# Patient Record
Sex: Male | Born: 1985 | Race: Black or African American | Hispanic: No | Marital: Single | Smoking: Never smoker
Health system: Southern US, Community
[De-identification: ages and names within clinical notes are randomized; demographics above are authoritative.]

---

## 2006-06-21 ENCOUNTER — Ambulatory Visit: Payer: Self-pay | Admitting: General Practice

## 2007-03-07 ENCOUNTER — Emergency Department: Payer: Self-pay | Admitting: Emergency Medicine

## 2008-03-07 IMAGING — CR RIGHT HAND - COMPLETE 3+ VIEW
1 series · 3 of 3 positions shown · non-contrast
Comparison: none

REASON FOR EXAM: pain
COMMENTS:

PROCEDURE:     DXR - DXR HAND RT COMPLETE W/OBLIQUES  - June 21, 2006  [DATE]
RESULT:     Three views show no fracture, dislocation or other acute bony
abnormality. There is noted soft tissue swelling at PIP joint of the fourth
finger.

[Series 1: view not recorded · 0.17mm/px · 3 of 3 slices shown]
[im 1/3]
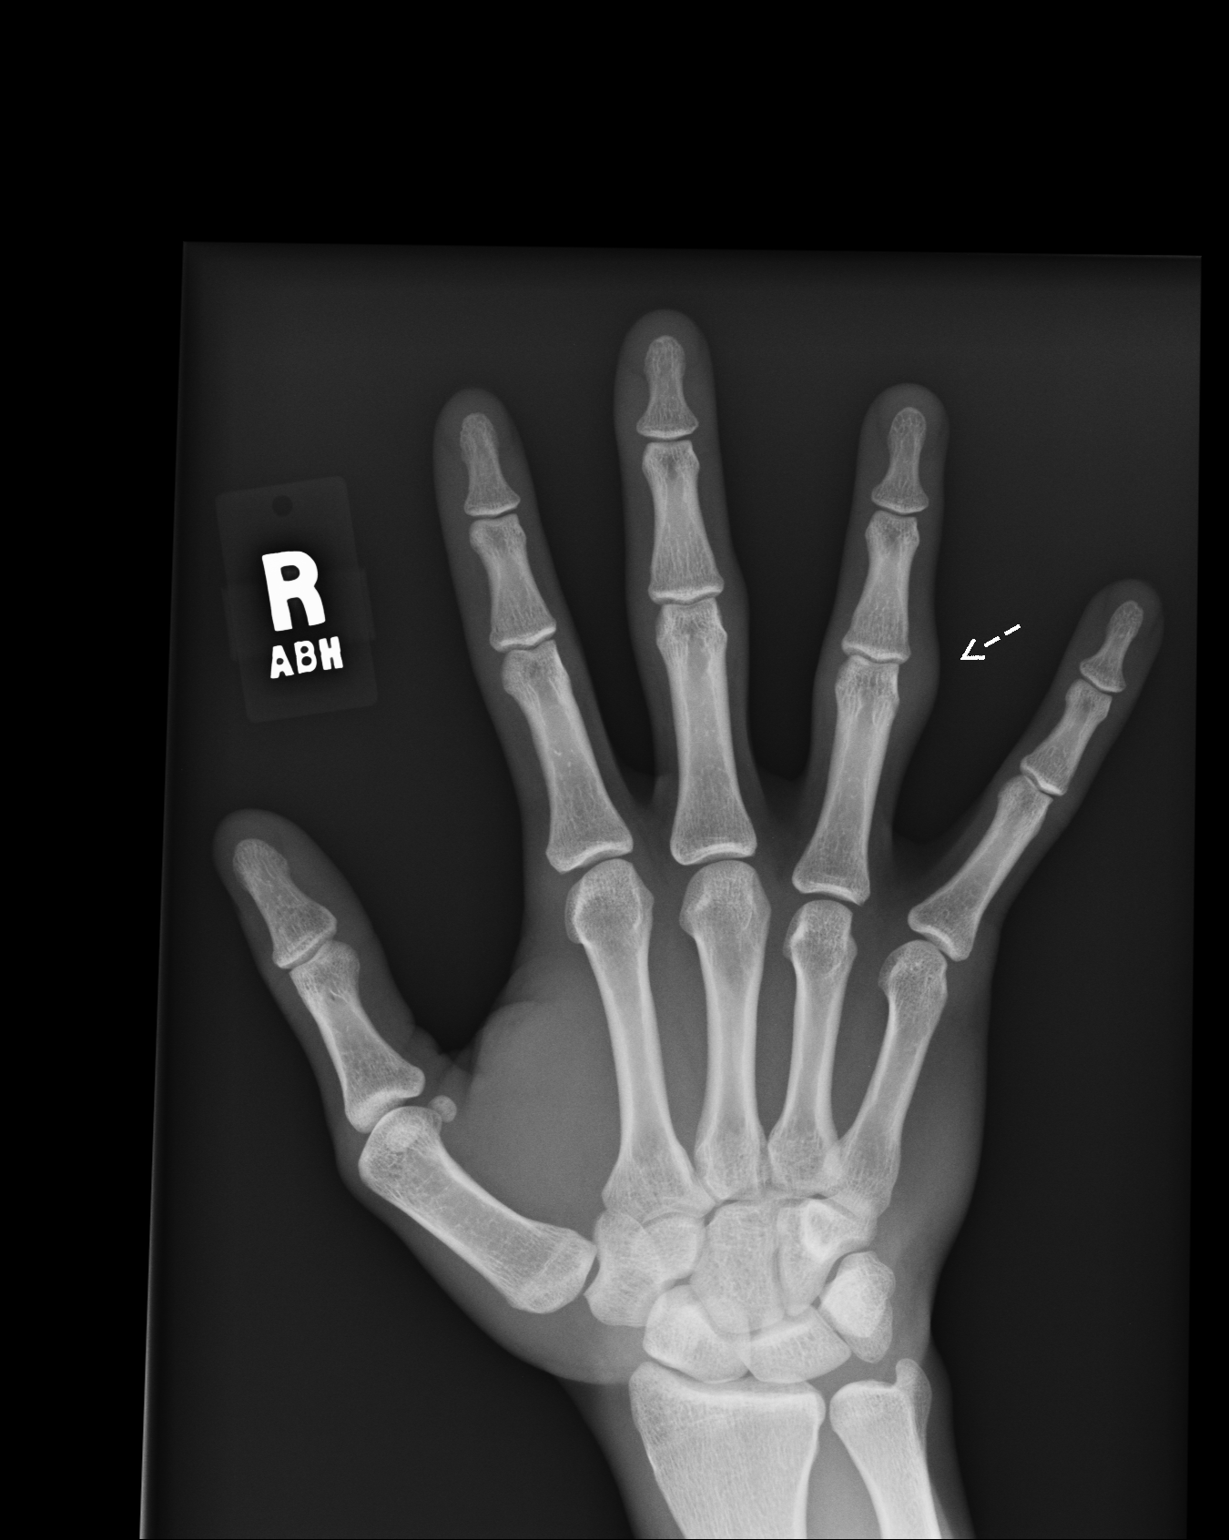
[im 2/3]
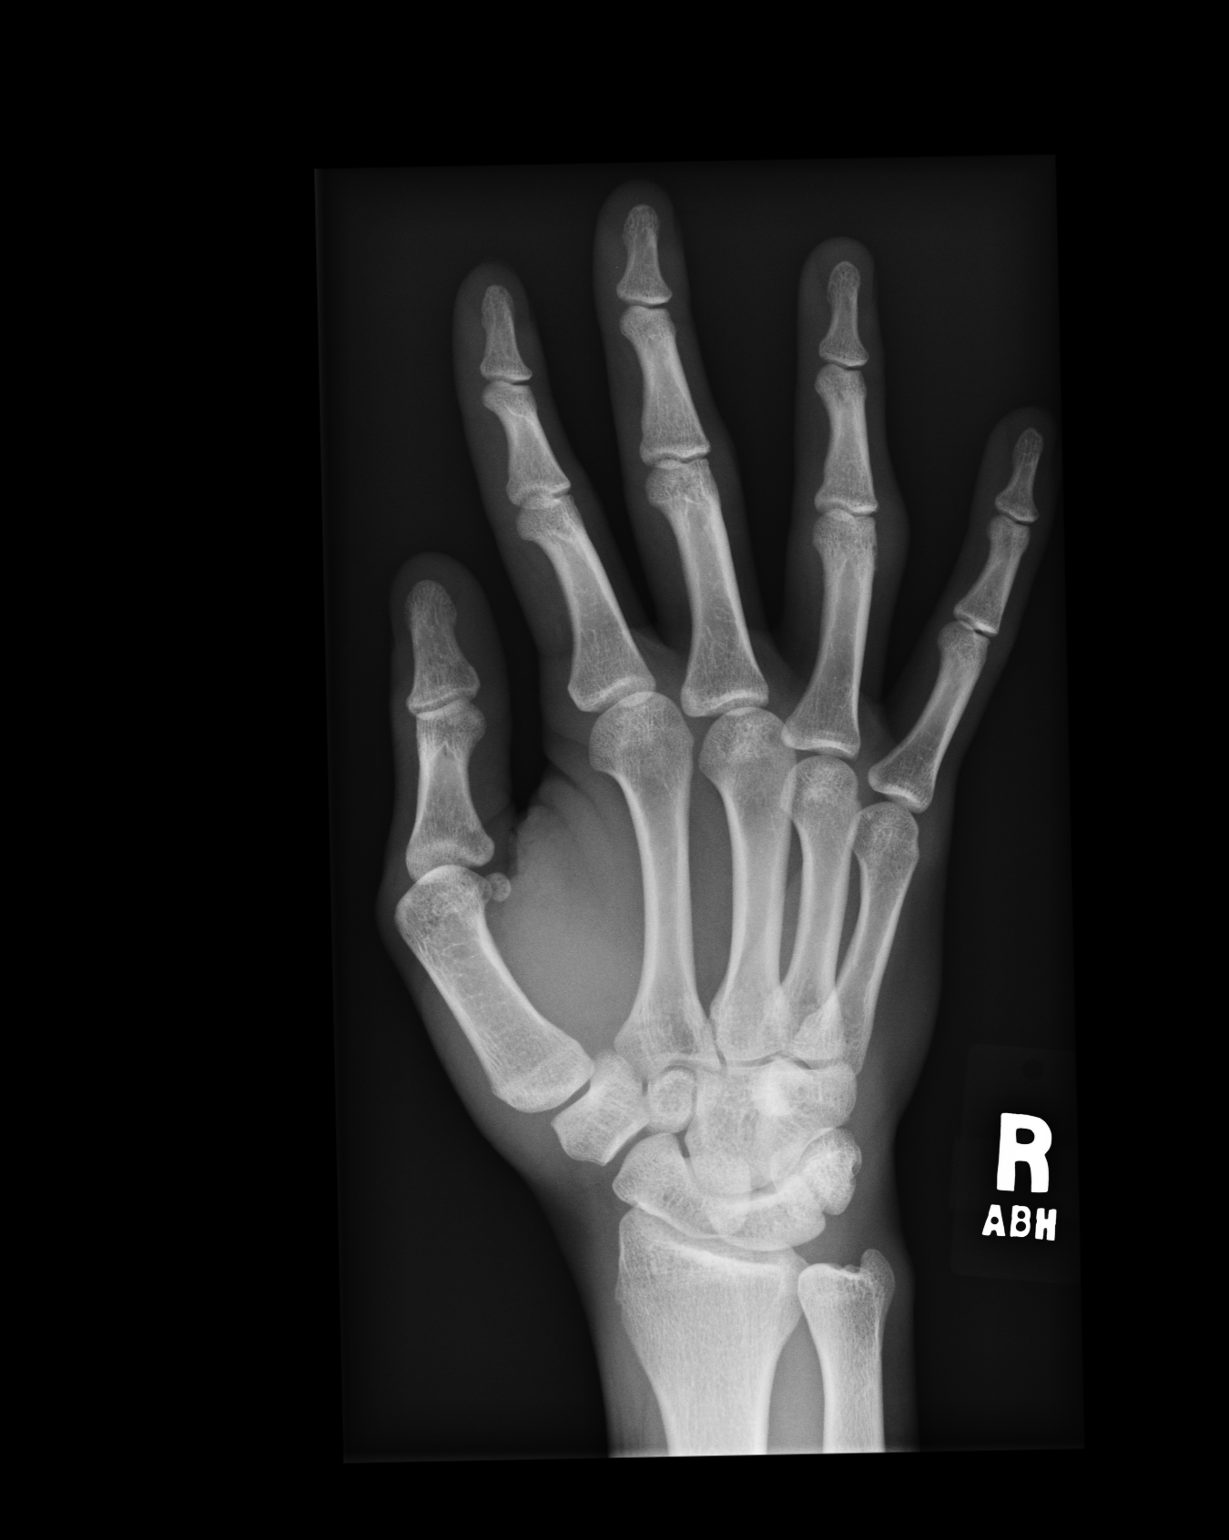
[im 3/3]
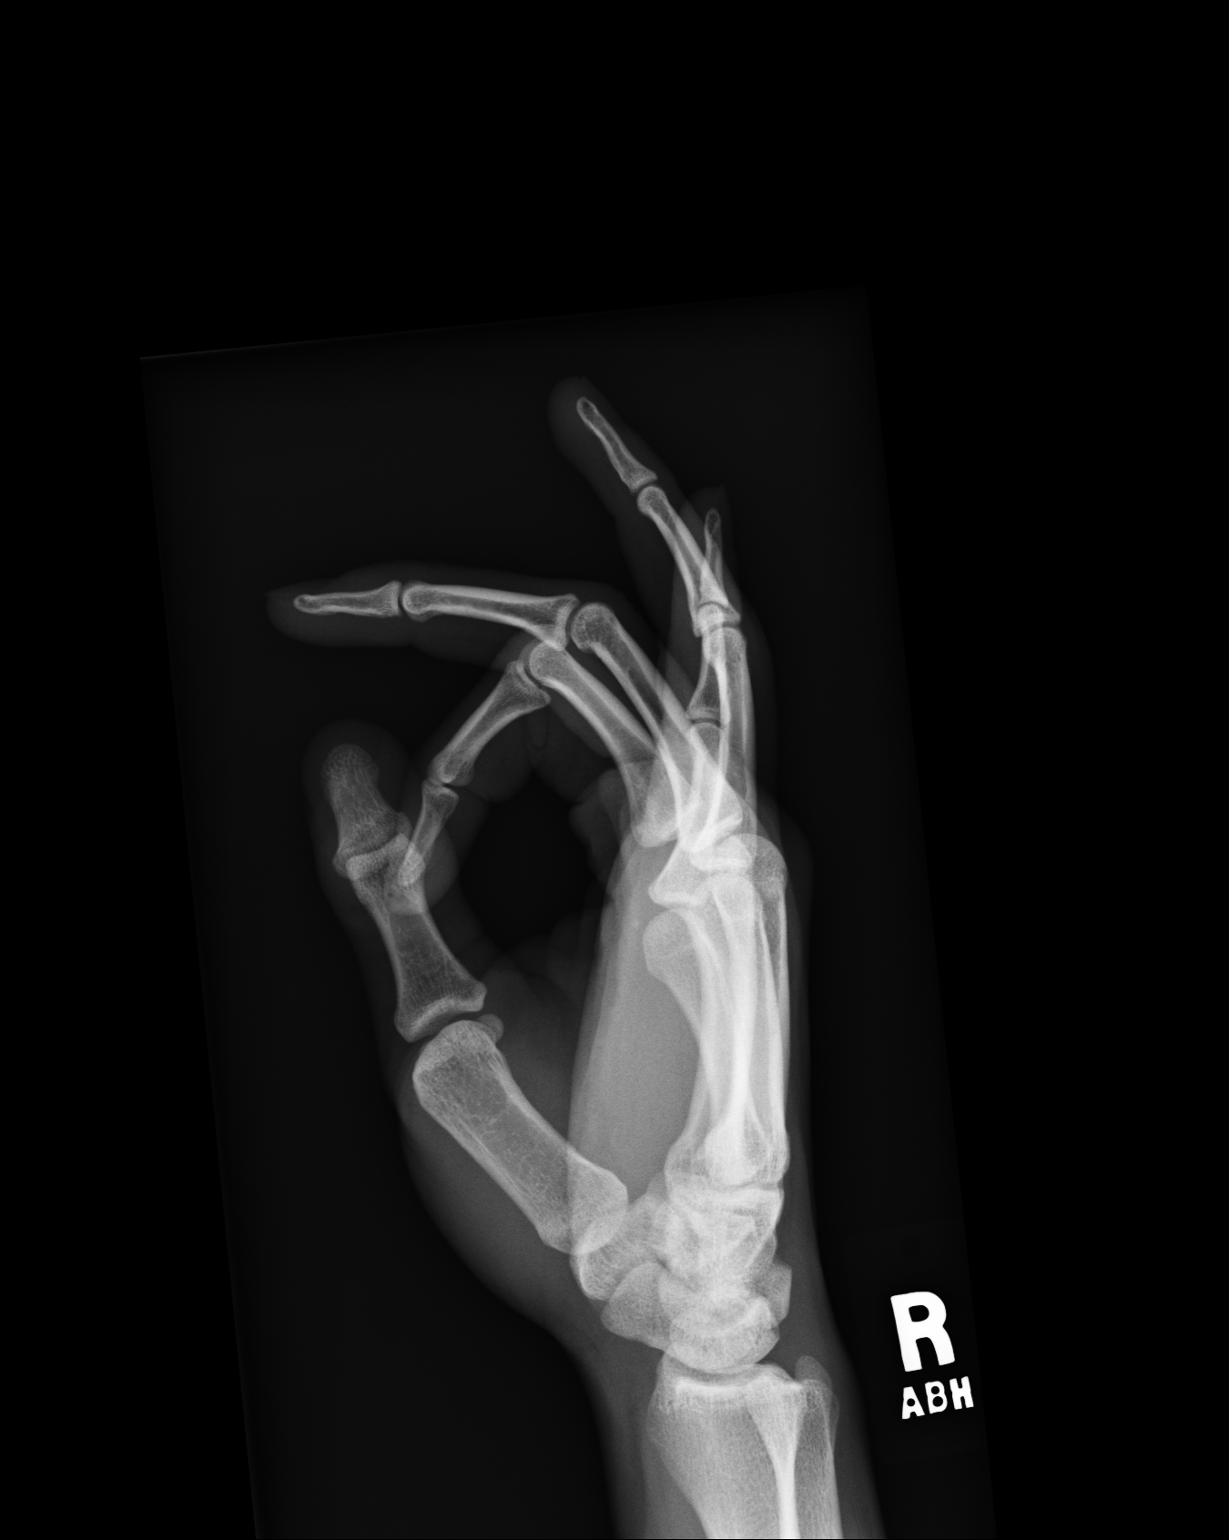

[3 of 3 positions shown; findings below may reference images not displayed]

IMPRESSION: No acute bony abnormalities are identified.

## 2011-03-04 ENCOUNTER — Encounter: Payer: Self-pay | Admitting: *Deleted

## 2011-03-04 ENCOUNTER — Emergency Department (HOSPITAL_COMMUNITY)
Admission: EM | Admit: 2011-03-04 | Discharge: 2011-03-04 | Disposition: A | Discharge status: Home / Self Care | Payer: Self-pay | Attending: Emergency Medicine | Admitting: Emergency Medicine

## 2011-03-04 DIAGNOSIS — H9319 Tinnitus, unspecified ear: Secondary | ICD-10-CM | POA: Insufficient documentation

## 2011-03-04 DIAGNOSIS — J45909 Unspecified asthma, uncomplicated: Secondary | ICD-10-CM | POA: Insufficient documentation

## 2011-03-04 DIAGNOSIS — J3489 Other specified disorders of nose and nasal sinuses: Secondary | ICD-10-CM | POA: Insufficient documentation

## 2011-03-04 DIAGNOSIS — R51 Headache: Secondary | ICD-10-CM | POA: Insufficient documentation

## 2011-03-04 DIAGNOSIS — H9209 Otalgia, unspecified ear: Secondary | ICD-10-CM | POA: Insufficient documentation

## 2011-03-04 DIAGNOSIS — J329 Chronic sinusitis, unspecified: Secondary | ICD-10-CM | POA: Insufficient documentation

## 2011-03-04 MED ORDER — CETIRIZINE-PSEUDOEPHEDRINE ER 5-120 MG PO TB12: 1.0000 | ORAL_TABLET | Freq: Every day | ORAL | Status: AC

## 2011-03-04 MED ORDER — IBUPROFEN 600 MG PO TABS: 600.0000 mg | ORAL_TABLET | Freq: Four times a day (QID) | ORAL | Status: AC | PRN

## 2011-03-04 MED ORDER — FLUTICASONE PROPIONATE 50 MCG/ACT NA SUSP: 2.0000 | Freq: Every day | NASAL | Status: DC

## 2011-03-04 NOTE — ED Notes (Signed)
Patient states he started having headaches with neck pain X 3 months ago and used tylenol but no relief. Patient states he has ringing in his right ear from time to time and it feels like he has water in his ear x 3 months.  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 Patient denies N/V with the headaches. Patient states he has dizziness with the headaches. Patient denies numbness or tingling in his extremities.

## 2011-03-04 NOTE — ED Notes (Signed)
C/o rt sided head aches x 3 months, states pain is intermittent and mostly when awakening. Also assoc with light sensitivity. Denies n/v. Pt also states feeling like rt ear is clogged. Pt in nad.

## 2011-03-04 NOTE — ED Provider Notes (Signed)
History     CSN: [id number]  Arrival date & time 03/04/11  1545   First MD Initiated Contact with Patient 03/04/11 1807      Chief Complaint  Patient presents with  . Migraine    (Consider location/radiation/quality/duration/timing/severity/associated sxs/prior treatment) Patient is a 25 y.o. male presenting with migraine. The history is provided by the patient.  Migraine This is a new problem. The current episode started more than 1 month ago. The problem occurs intermittently. Associated symptoms include congestion and headaches. Pertinent negatives include no abdominal pain, chills, coughing, diaphoresis, fatigue, fever, myalgias, nausea, rash, sore throat, swollen glands, vomiting or weakness. The symptoms are aggravated by nothing. He has tried nothing for the symptoms.  Pt reports right sided headache over right eye, ear, temple area. States feels congested, ear is popping, ringing at times, nasal congestion. States taking tylenol with no improvement. Denies fever, chills, malaise. Pain on and off. Denies dizziness, blurred vision, nausea, vomiting, weakness, focal neurological symptoms.   Past Medical History  Diagnosis Date  . Asthma when he was a child but grew out of it.    History reviewed. No pertinent past surgical history.  History reviewed. No pertinent family history.  History  Substance Use Topics  . Smoking status: Never Smoker   . Smokeless tobacco: Not on file  . Alcohol Use: No      Review of Systems  Constitutional: Negative for fever, chills, diaphoresis and fatigue.  HENT: Positive for congestion. Negative for sore throat.   Eyes: Negative.   Respiratory: Negative for cough.   Cardiovascular: Negative.   Gastrointestinal: Negative.  Negative for nausea, vomiting and abdominal pain.  Genitourinary: Negative.   Musculoskeletal: Negative for myalgias.  Skin: Negative for rash.  Neurological: Positive for headaches. Negative for weakness.    Psychiatric/Behavioral: Negative.     Allergies  Sulfa antibiotics  Home Medications  No current outpatient prescriptions on file.  BP 119/68  Pulse 94  Temp(Src) 98.2 F (36.8 C) (Oral)  Resp 19  SpO2 98%  Physical Exam  Nursing note and vitals reviewed. Constitutional: He is oriented to person, place, and time. He appears well-nourished.  HENT:  Head: Normocephalic.  Right Ear: External ear and ear canal normal.  Left Ear: Tympanic membrane and external ear normal.  Nose: Rhinorrhea present. Right sinus exhibits maxillary sinus tenderness and frontal sinus tenderness. Left sinus exhibits no maxillary sinus tenderness and no frontal sinus tenderness.  Mouth/Throat: Uvula is midline, oropharynx is clear and moist and mucous membranes are normal.       Fluid behind right TM  Eyes: Conjunctivae and EOM are normal. Pupils are equal, round, and reactive to light.  Neck: Neck supple.  Cardiovascular: Normal rate, regular rhythm and normal heart sounds.   Pulmonary/Chest: Effort normal and breath sounds normal. No respiratory distress.  Musculoskeletal: Normal range of motion. He exhibits no edema.  Neurological: He is alert and oriented to person, place, and time. He has normal reflexes. No cranial nerve deficit. Coordination abnormal.  Skin: Skin is warm and dry. No erythema.  Psychiatric: He has a normal mood and affect.    ED Course  Procedures (including critical care time)  Pt is a healthy 25yo male, no medical problems. VS normal. Exam normal. Complaining of pressure like headache over right eye and ear, along with congestion. Right TM with fluid behind it, rhinorrhea noted on exam. Sinuses tender. Will start treatment for sinusitis. If not improving advised to follow up for recheck and further  evaluation. At this time no signs of intracranial abnormalities based on exam and hx.   MDM          Lottie Mussel, PA 03/04/11 1827

## 2011-03-04 NOTE — ED Provider Notes (Signed)
Medical screening examination/treatment/procedure(s) were performed by non-physician practitioner and as supervising physician I was immediately available for consultation/collaboration.  Geet Hosking L Alani Sabbagh, MD 03/04/11 2043 

## 2019-03-05 ENCOUNTER — Other Ambulatory Visit: Payer: Self-pay

## 2019-03-05 ENCOUNTER — Emergency Department (HOSPITAL_COMMUNITY)
Admission: EM | Admit: 2019-03-05 | Discharge: 2019-03-06 | Disposition: A | Discharge status: Home / Self Care | Payer: Medicaid Other | Attending: Emergency Medicine | Admitting: Emergency Medicine

## 2019-03-05 ENCOUNTER — Encounter (HOSPITAL_COMMUNITY): Payer: Self-pay

## 2019-03-05 DIAGNOSIS — R109 Unspecified abdominal pain: Secondary | ICD-10-CM

## 2019-03-05 DIAGNOSIS — R1011 Right upper quadrant pain: Secondary | ICD-10-CM | POA: Diagnosis present

## 2019-03-05 LAB — COMPREHENSIVE METABOLIC PANEL
ALT: 14 U/L (ref 0–44)
AST: 22 U/L (ref 15–41)
Albumin: 4.5 g/dL (ref 3.5–5.0)
Alkaline Phosphatase: 45 U/L (ref 38–126)
Anion gap: 8 (ref 5–15)
BUN: 8 mg/dL (ref 6–20)
CO2: 27 mmol/L (ref 22–32)
Calcium: 9.3 mg/dL (ref 8.9–10.3)
Chloride: 105 mmol/L (ref 98–111)
Creatinine, Ser: 1.02 mg/dL (ref 0.61–1.24)
GFR calc Af Amer: >60 mL/min (ref >60)
GFR calc non Af Amer: >60 mL/min (ref >60)
Glucose, Bld: 60 mg/dL — ABNORMAL LOW (ref 70–99)
Potassium: 3.8 mmol/L (ref 3.5–5.1)
Sodium: 140 mmol/L (ref 135–145)
Total Bilirubin: 0.9 mg/dL (ref 0.3–1.2)
Total Protein: 7.4 g/dL (ref 6.5–8.1)

## 2019-03-05 LAB — CBC
HCT: 44.1 % (ref 39.0–52.0)
Hemoglobin: 15.4 g/dL (ref 13.0–17.0)
MCH: 31.8 pg (ref 26.0–34.0)
MCHC: 34.9 g/dL (ref 30.0–36.0)
MCV: 90.9 fL (ref 80.0–100.0)
Platelets: 261 10*3/uL (ref 150–400)
RBC: 4.85 MIL/uL (ref 4.22–5.81)
RDW: 11.9 % (ref 11.5–15.5)
WBC: 5.3 10*3/uL (ref 4.0–10.5)
nRBC: 0 % (ref 0.0–0.2)

## 2019-03-05 LAB — LIPASE, BLOOD: Lipase: 29 U/L (ref 11–51)

## 2019-03-05 NOTE — ED Triage Notes (Signed)
Pt reports RUQ pain for 2 months now, denies nausea or vomiting.

## 2019-03-06 ENCOUNTER — Emergency Department (HOSPITAL_COMMUNITY): Payer: Medicaid Other

## 2019-03-06 LAB — URINALYSIS, ROUTINE W REFLEX MICROSCOPIC
Bilirubin Urine: NEGATIVE
Glucose, UA: NEGATIVE mg/dL
Hgb urine dipstick: NEGATIVE
Ketones, ur: NEGATIVE mg/dL
Leukocytes,Ua: NEGATIVE
Nitrite: NEGATIVE
Protein, ur: NEGATIVE mg/dL
Specific Gravity, Urine: 1.027 (ref 1.005–1.030)
pH: 5 (ref 5.0–8.0)

## 2019-03-06 LAB — CBG MONITORING, ED: Glucose-Capillary: 90 mg/dL (ref 70–99)

## 2019-03-06 NOTE — ED Provider Notes (Signed)
[facility] EMERGENCY DEPARTMENT Provider Note   CSN: [id number] Arrival date & time: 03/05/19  1824     History Chief Complaint  Patient presents with  . Abdominal Pain    Charles Huerta is a 33 y.o. male with no ppmh presents to ER for evaluation of abdominal pain. Located in RUQ described as "slight discomfort". Onset 2.5 months ago. Intermittent, non radiating. Worse with sitting for prolonged sitting, bending over, palpation. It feels like a twitching muscle.  States it is not a pain but a "discomfort", currently mild.  No changes with eating, drinking breathing.  No interventions.  Had tingling with urination for a couple of days one week ago and girlfriend got him a [facility] UTI test that came back positive.  States tingling with urination has resolved.  No hematuria, urinary urgency or frequency. No penile discharge or testicular pain. He is sexually active with male partner without condom use but states he has no concern for STD.  Would like to be tested to be certain.    No issues with gallbladder before. No abdominal surgeries. No associated fever, nausea, vomiting, post prandrial pain or acid reflux symptoms. No associated CP, Sob, cough.   HPI     Past Medical History:  Diagnosis Date  . Asthma when he was a child but grew out of it.    There are no problems to display for this patient.   History reviewed. No pertinent surgical history.     No family history on file.  Social History   Tobacco Use  . Smoking status: Never Smoker  Substance Use Topics  . Alcohol use: No  . Drug use: Not on file    Home Medications Prior to Admission medications   Medication Sig Start Date End Date Taking? Authorizing Provider  fluticasone (FLONASE) 50 MCG/ACT nasal spray Place 2 sprays into the nose daily. 03/04/11 03/03/12  Kirichenko, Lemont Fillers, PA-C    Allergies    Sulfa antibiotics  Review of Systems   Review of Systems  Gastrointestinal: Positive  for abdominal pain.  Genitourinary: Positive for difficulty urinating (tingling, resolved).  All other systems reviewed and are negative.   Physical Exam Updated Vital Signs BP 130/69 (BP Location: Right Arm)   Pulse (!) 58   Temp 97.7 F (36.5 C) (Oral)   Resp 13   Ht 5' 10.5" (1.791 m)   Wt 77.1 kg   SpO2 100%   BMI 24.05 kg/m   Physical Exam Vitals and nursing note reviewed.  Constitutional:      Appearance: He is well-developed.     Comments: Non toxic.  HENT:     Head: Normocephalic and atraumatic.     Nose: Nose normal.  Eyes:     Conjunctiva/sclera: Conjunctivae normal.  Cardiovascular:     Rate and Rhythm: Normal rate and regular rhythm.     Heart sounds: Normal heart sounds.  Pulmonary:     Effort: Pulmonary effort is normal.     Breath sounds: Normal breath sounds.  Abdominal:     General: Bowel sounds are normal.     Palpations: Abdomen is soft.     Tenderness: There is no abdominal tenderness.     Comments: No G/R/R. No suprapubic or CVA tenderness. Negative Murphy's and McBurney's  Genitourinary:    Comments: Patient declined GU exam  Musculoskeletal:        General: Normal range of motion.     Cervical back: Normal range of motion.  Skin:  General: Skin is warm and dry.     Capillary Refill: Capillary refill takes less than 2 seconds.     Comments: Skin normal over abdomen/flank, no rash or lesions  Neurological:     Mental Status: He is alert.  Psychiatric:        Behavior: Behavior normal.     ED Results / Procedures / Treatments   Labs (all labs ordered are listed, but only abnormal results are displayed) Labs Reviewed  COMPREHENSIVE METABOLIC PANEL - Abnormal; Notable for the following components:      Result Value   Glucose, Bld 60 (*)    All other components within normal limits  LIPASE, BLOOD  CBC  URINALYSIS, ROUTINE W REFLEX MICROSCOPIC  CBG MONITORING, ED  GC/CHLAMYDIA PROBE AMP ([place]) NOT AT [facility]     EKG None  Radiology US Abdomen Limited RUQ  Result Date: 03/06/2019 CLINICAL DATA:  Right upper quadrant pain EXAM: ULTRASOUND ABDOMEN LIMITED RIGHT UPPER QUADRANT COMPARISON:  None. FINDINGS: Gallbladder: No gallstones or wall thickening visualized. There is no pericholecystic fluid. No sonographic Murphy sign noted by sonographer. Common bile duct: Diameter: 2 mm. No intrahepatic or extrahepatic biliary duct dilatation. Liver: No focal lesion identified. Within normal limits in parenchymal echogenicity. Portal vein is patent on color Doppler imaging with normal direction of blood flow towards the liver. Other: None. IMPRESSION: Study within normal limits. Electronically Signed   By: Lowella Grip III M.D.   On: 03/06/2019 07:52    Procedures Procedures (including critical care time)  Medications Ordered in ED Medications - No data to display  ED Course  I have reviewed the triage vital signs and the nursing notes.  Pertinent labs & imaging results that were available during my care of the patient were reviewed by me and considered in my medical decision making (see chart for details).    MDM Rules/Calculators/A&P                      Highest on ddx is MSK etiology vs gall bladder stones. Doubt cholecystitis, choledocholithiasis, cholangitis given chronicity of symptoms, lack of constitutional symptoms, and benign exam.  Skin normal over this area. Doubt GU process and initial tingling with urination has resolved and he has no UTI symptoms, CVAT.  No radiation of pain into chest, SOB, cough, pleuritic component.   ER work-up initiated in triage personally reviewed and normal.  LFTs, lipase and WBC normal.  UA unremarkable. RUQ normal.   Given transient resolved discomfort with urination, urine culture and urine GC/c will be added. Patient has no concern for STD. He declined GU exam but denies any skin irritation, testicular pain, discharge.    Recommend NSAID as needed, PCP  f/u for continued abdominal pain. Return precautions given.  Final Clinical Impression(s) / ED Diagnoses Final diagnoses:  RUQ pain  Right lateral abdominal pain    Rx / DC Orders ED Discharge Orders    None       Kinnie Feil, PA-C 03/06/19 0827    Davonna Belling, MD 03/06/19 1055

## 2019-03-06 NOTE — Discharge Instructions (Signed)
You were seen in the ER for right abdominal pain.  Lab work was normal.  Ultrasound of your right upper abdomen, liver and gallbladder are normal.  Cause of your pain is unclear, could be related to muscular injury.  Alternate ibuprofen and acetaminophen as needed for pain.  If your pain continues follow-up with your primary care doctor for further instructions and possible work-up.  Return to the ER for fever greater than 100.4, nausea, vomiting, worsening constant right upper quadrant or right lower abdominal pain, urinary symptoms, rash.  Cause of your urinary discomfort a couple of days ago is also unclear.  Your urine test was normal.  We have sent gonorrhea and Chlamydia testing to rule this out, follow-up on these results on your MyChart.  Return to the ER for return of symptoms, penile discharge, testicular pain, abdominal pain, fever

## 2019-03-06 NOTE — ED Notes (Signed)
Patient verbalizes understanding of discharge instructions. Opportunity for questioning and answers were provided. Armband removed by staff, pt discharged from ED.  

## 2019-03-07 LAB — GC/CHLAMYDIA PROBE AMP (~~LOC~~) NOT AT ARMC
Chlamydia: NEGATIVE
Neisseria Gonorrhea: NEGATIVE

## 2019-08-30 ENCOUNTER — Other Ambulatory Visit: Payer: Self-pay

## 2019-08-30 ENCOUNTER — Encounter (HOSPITAL_COMMUNITY): Payer: Self-pay

## 2019-08-30 ENCOUNTER — Emergency Department (HOSPITAL_COMMUNITY)
Admission: EM | Admit: 2019-08-30 | Discharge: 2019-08-30 | Disposition: A | Discharge status: Home / Self Care | Payer: Medicaid Other | Attending: Emergency Medicine | Admitting: Emergency Medicine

## 2019-08-30 DIAGNOSIS — M545 Low back pain, unspecified: Secondary | ICD-10-CM

## 2019-08-30 DIAGNOSIS — J45909 Unspecified asthma, uncomplicated: Secondary | ICD-10-CM | POA: Diagnosis not present

## 2019-08-30 MED ORDER — CYCLOBENZAPRINE HCL 10 MG PO TABS: 10.0000 mg | ORAL_TABLET | Freq: Two times a day (BID) | ORAL | 0 refills | Status: DC | PRN

## 2019-08-30 MED ORDER — LIDOCAINE 5 % EX PTCH
1.0000 | MEDICATED_PATCH | Freq: Once | CUTANEOUS | Status: DC
  Administered 2019-08-30: 1 via TRANSDERMAL
  Filled 2019-08-30: qty 1

## 2019-08-30 MED ORDER — NAPROXEN 500 MG PO TABS: 500.0000 mg | ORAL_TABLET | Freq: Two times a day (BID) | ORAL | 0 refills | Status: AC

## 2019-08-30 MED ORDER — NAPROXEN 250 MG PO TABS
500.0000 mg | ORAL_TABLET | Freq: Once | ORAL | Status: AC
  Administered 2019-08-30: 500 mg via ORAL
  Filled 2019-08-30: qty 2

## 2019-08-30 NOTE — ED Triage Notes (Signed)
Patient complains of right side and lower back pain. Works for [employer] and does a lot of lifting. Pain worse with any movement and change in position

## 2019-08-30 NOTE — ED Provider Notes (Signed)
[facility] EMERGENCY DEPARTMENT Provider Note   CSN: [id number] Arrival date & time: 08/30/19  0818     History Chief Complaint  Patient presents with  . back pain/ side pain    Charles Huerta is a 34 y.o. male with past medical history significant for asthma.  HPI Patient presents to the emergency department today with chief complaint of right side and low back pain x2 days.  Patient states he works for a [employer] and was lifting a heavy couch.  He states later in the day he had right-sided low back pain.  He states the pain is intermittent and is worse with movement or bending over. There is no radiation of pain.  He rates the pain 8 of 10 in severity.  He has not taken any medications for his symptoms prior to arrival. Denies fevers, weight loss, numbness/weakness of upper and lower extremities, bowel/bladder incontinence, urinary retention, history of cancer, saddle anesthesia, history of back surgery, history of IVDA.     Past Medical History:  Diagnosis Date  . Asthma when he was a child but grew out of it.    There are no problems to display for this patient.   History reviewed. No pertinent surgical history.     No family history on file.  Social History   Tobacco Use  . Smoking status: Never Smoker  Substance Use Topics  . Alcohol use: No  . Drug use: Not on file    Home Medications Prior to Admission medications   Medication Sig Start Date End Date Taking? Authorizing Provider  cyclobenzaprine (FLEXERIL) 10 MG tablet Take 1 tablet (10 mg total) by mouth 2 (two) times daily as needed for muscle spasms. 08/30/19   Leahna Hewson E, PA-C  fluticasone (FLONASE) 50 MCG/ACT nasal spray Place 2 sprays into the nose daily. 03/04/11 03/03/12  Kirichenko, Lemont Fillers, PA-C  naproxen (NAPROSYN) 500 MG tablet Take 1 tablet (500 mg total) by mouth 2 (two) times daily for 7 days. 08/30/19 09/06/19  Persephonie Hegwood, Caroleen Hamman, PA-C    Allergies    Sulfa  antibiotics  Review of Systems   Review of Systems  All other systems are reviewed and are negative for acute change except as noted in the HPI.   Physical Exam Updated Vital Signs BP 116/73 (BP Location: Left Arm)   Pulse (!) 53   Temp 98 F (36.7 C) (Oral)   Resp 18   Ht 5\' 11"  (1.803 m)   Wt 74.8 kg   SpO2 99%   BMI 23.01 kg/m   Physical Exam Vitals and nursing note reviewed.  Constitutional:      Appearance: He is well-developed. He is not ill-appearing or toxic-appearing.  HENT:     Head: Normocephalic and atraumatic.     Nose: Nose normal.  Eyes:     General: No scleral icterus.       Right eye: No discharge.        Left eye: No discharge.     Conjunctiva/sclera: Conjunctivae normal.  Neck:     Vascular: No JVD.  Cardiovascular:     Rate and Rhythm: Normal rate and regular rhythm.     Pulses: Normal pulses.     Heart sounds: Normal heart sounds.  Pulmonary:     Effort: Pulmonary effort is normal.     Breath sounds: Normal breath sounds.  Abdominal:     General: There is no distension.  Musculoskeletal:  General: Normal range of motion.       Arms:     Cervical back: Normal range of motion.     Comments: Tenderness to palpation as depicted image above.  Patient has no overlying skin changes.  No midline spinous tenderness of cervical, thoracic or lumbar spine. No paraspinal tenderness. No step offs, crepitus or deformity palpated.  Full range of motion of the T-spine and L-spine No tenderness to palpation of the spinous processes of the T-spine or L-spine No crepitus, deformity or step-offs    Skin:    General: Skin is warm and dry.  Neurological:     Mental Status: He is oriented to person, place, and time.     GCS: GCS eye subscore is 4. GCS verbal subscore is 5. GCS motor subscore is 6.     Comments: Fluent speech, no facial droop.  Sensation grossly intact to light touch in the lower extremities bilaterally. No saddle anesthesias. Strength  5/5 with flexion and extension at the bilateral hips, knees, and ankles. No noted gait deficit. Coordination intact with heel to shin testing.   Psychiatric:        Behavior: Behavior normal.     ED Results / Procedures / Treatments   Labs (all labs ordered are listed, but only abnormal results are displayed) Labs Reviewed - No data to display  EKG None  Radiology No results found.  Procedures Procedures (including critical care time)  Medications Ordered in ED Medications  lidocaine (LIDODERM) 5 % 1 patch (has no administration in time range)  naproxen (NAPROSYN) tablet 500 mg (has no administration in time range)    ED Course  I have reviewed the triage vital signs and the nursing notes.  Pertinent labs & imaging results that were available during my care of the patient were reviewed by me and considered in my medical decision making (see chart for details).    MDM Rules/Calculators/A&P                          History provided by patient with additional history obtained from chart review.    Patient with right lower back pain.  No neurological deficits and normal neuro exam.  Patient ambulates with normal gait.  No loss of bowel or bladder control.  No concern for cauda equina.  No fever, night sweats, weight loss, h/o cancer, IVDU.  RICE protocol and muscle relaxer indicated and discussed with patient.  Patient advised not to drive or work while taking Flexeril.  The patient appears reasonably screened and/or stabilized for discharge and I doubt any other medical condition or other [facility] requiring further screening, evaluation, or treatment in the ED at this time prior to discharge. The patient is safe for discharge with strict return precautions discussed. Recommend pcp follow up if symptoms persist.   Final Clinical Impression(s) / ED Diagnoses Final diagnoses:  Acute right-sided low back pain without sciatica    Rx / DC Orders ED Discharge Orders          Ordered    naproxen (NAPROSYN) 500 MG tablet  2 times daily     Discontinue  Reprint     08/30/19 1247    cyclobenzaprine (FLEXERIL) 10 MG tablet  2 times daily PRN     Discontinue  Reprint     08/30/19 Dauphin, Harley Hallmark, PA-C 08/30/19 1253    Tegeler, Gwenyth Allegra,  MD 08/30/19 1633

## 2019-08-30 NOTE — Discharge Instructions (Signed)
Your back pain should be treated with medicines such tylenol and  naproxen this back pain should get better over the next 2 weeks.   Follow Up: Please follow up with your primary healthcare provider in 1-2 weeks for reassessment. if you do not have a primary care doctor use the resource guide provided to find one. If you do not have a primary care physician, contact [facility] at [phone] for referral  Low back pain is discomfort in the lower back that may be due to injuries to muscles and ligaments around the spine. Occasionally, it may be caused by a a problem to a part of the spine called a disc. The pain may last several days or a week;  However, most patients get completely well in 4 weeks.   1. Medications: Naproxen 500 mg twice and [id number] mg of Tylenol every 3 hours as needed for pain. Do not exceed 4000 mg of Tylenol daily.  Take Naproxen  with food to avoid upset stomach issues.  Do not take any additional Aleve, Motrin, ibuprofen while you are taking the naproxen as these medications are all similar.  -You can also buy over-the-counter pain patches as we discussed.  To help with the pain.  Ask the pharmacist when you pick up your prescriptions at you have any questions.    Muscle relaxants:  These medications can help with muscle tightness that is a cause of lower back pain. Most of these medications can cause drowsiness, and it is not safe to drive or use dangerous machinery while taking them.You can take Flexeril as needed for muscle spasm up to twice daily but do not drive, drink alcohol, or operate heavy machinery while taking this medicine because it may make you drowsy.  I typically recommend taking this medicine only at night when you are going to sleep.  You can also cut these tablets in half if they make you feel very drowsy.  2. Treatment: rest, drink plenty of fluids, gentle stretching as discussed (see attached), alternate ice and heat (or stick with whichever feels  best) 20 minutes on 20 minutes off. Maintaining your daily activities, including walking, is encourged, as it will help you get better faster than just staying in bed.    Be aware that if you develop new symptoms, such as a fever, leg weakness, difficulty with or loss of control of your urine or bowels, abdominal pain, or more severe pain, you will need to seek medical attention immediately and  / or return to the Emergency department.

## 2019-08-30 NOTE — ED Notes (Signed)
ED Provider at bedside. 

## 2019-11-15 ENCOUNTER — Emergency Department (HOSPITAL_COMMUNITY)
Admission: EM | Admit: 2019-11-15 | Discharge: 2019-11-15 | Disposition: A | Discharge status: Home / Self Care | Payer: Medicaid Other | Attending: Emergency Medicine | Admitting: Emergency Medicine

## 2019-11-15 ENCOUNTER — Encounter (HOSPITAL_COMMUNITY): Payer: Self-pay | Admitting: Emergency Medicine

## 2019-11-15 ENCOUNTER — Other Ambulatory Visit: Payer: Self-pay

## 2019-11-15 DIAGNOSIS — R0981 Nasal congestion: Secondary | ICD-10-CM

## 2019-11-15 DIAGNOSIS — J45909 Unspecified asthma, uncomplicated: Secondary | ICD-10-CM | POA: Diagnosis not present

## 2019-11-15 DIAGNOSIS — U071 COVID-19: Secondary | ICD-10-CM | POA: Diagnosis not present

## 2019-11-15 DIAGNOSIS — R0989 Other specified symptoms and signs involving the circulatory and respiratory systems: Secondary | ICD-10-CM | POA: Diagnosis present

## 2019-11-15 LAB — SARS CORONAVIRUS 2 BY RT PCR (HOSPITAL ORDER, PERFORMED IN ~~LOC~~ HOSPITAL LAB): SARS Coronavirus 2: POSITIVE — AB

## 2019-11-15 MED ORDER — CETIRIZINE-PSEUDOEPHEDRINE ER 5-120 MG PO TB12: 1.0000 | ORAL_TABLET | Freq: Every day | ORAL | 0 refills | Status: AC

## 2019-11-15 MED ORDER — FLUTICASONE PROPIONATE 50 MCG/ACT NA SUSP: 1.0000 | Freq: Every day | NASAL | 2 refills | Status: AC

## 2019-11-15 NOTE — ED Provider Notes (Signed)
[facility] EMERGENCY DEPARTMENT Provider Note   CSN: [id number] Arrival date & time: 11/15/19  1131     History Chief Complaint  Patient presents with  . Facial Pain    Charles Huerta is a 34 y.o. male with no significant past medical history presenting to the ED today for nasal congestion, pressure behind his ears gradually worsening over 3 days.  States that this is giving him some difficulty sleeping although states that his breathing is okay.  Denies fever chills or sick contacts.  Has a history of the same.  Denies dental pain or purulent nasal discharge.  The history is provided by the patient.  Illness Quality:  Congestion Severity:  Moderate Onset quality:  Gradual Duration:  3 days Timing:  Constant Progression:  Worsening Chronicity:  New Associated symptoms: congestion and ear pain   Associated symptoms: no abdominal pain, no chest pain, no cough, no fever, no headaches, no rash, no shortness of breath and no vomiting        Past Medical History:  Diagnosis Date  . Asthma when he was a child but grew out of it.    There are no problems to display for this patient.   History reviewed. No pertinent surgical history.     No family history on file.  Social History   Tobacco Use  . Smoking status: Never Smoker  . Smokeless tobacco: Never Used  Substance Use Topics  . Alcohol use: No  . Drug use: Yes    Types: Marijuana    Home Medications Prior to Admission medications   Medication Sig Start Date End Date Taking? Authorizing Provider  cetirizine-pseudoephedrine (ZYRTEC-D) 5-120 MG tablet Take 1 tablet by mouth daily. 11/15/19   Loree Fee, MD  cyclobenzaprine (FLEXERIL) 10 MG tablet Take 1 tablet (10 mg total) by mouth 2 (two) times daily as needed for muscle spasms. 08/30/19   Albrizze, Kaitlyn E, PA-C  fluticasone (FLONASE) 50 MCG/ACT nasal spray Place 1 spray into both nostrils daily. 11/15/19 11/14/20  Loree Fee, MD     Allergies    Sulfa antibiotics  Review of Systems   Review of Systems  Constitutional: Negative for chills and fever.  HENT: Positive for congestion, ear pain and sinus pressure. Negative for facial swelling and voice change.   Eyes: Negative for redness and visual disturbance.  Respiratory: Negative for cough and shortness of breath.   Cardiovascular: Negative for chest pain and palpitations.  Gastrointestinal: Negative for abdominal pain and vomiting.  Genitourinary: Negative for difficulty urinating and dysuria.  Musculoskeletal: Negative for gait problem and joint swelling.  Skin: Negative for rash and wound.  Neurological: Negative for dizziness and headaches.  Psychiatric/Behavioral: Negative for confusion and suicidal ideas.    Physical Exam Updated Vital Signs BP 130/89   Pulse 66   Temp 98.7 F (37.1 C)   Resp 20   SpO2 100%   Physical Exam Constitutional:      General: He is not in acute distress. HENT:     Head: Normocephalic and atraumatic.     Comments: No pain on percussion over sinuses    Ears:     Comments: Bilateral clear middle ear effusion    Mouth/Throat:     Mouth: Mucous membranes are moist.     Pharynx: Oropharynx is clear.  Eyes:     General: No scleral icterus.    Pupils: Pupils are equal, round, and reactive to light.  Cardiovascular:     Rate and  Rhythm: Normal rate and regular rhythm.     Pulses: Normal pulses.  Pulmonary:     Effort: Pulmonary effort is normal. No respiratory distress.     Breath sounds: No wheezing.  Musculoskeletal:        General: No tenderness or deformity.     Cervical back: Normal range of motion and neck supple.  Neurological:     General: No focal deficit present.     Mental Status: He is alert and oriented to person, place, and time.  Psychiatric:        Mood and Affect: Mood normal.        Behavior: Behavior normal.     ED Results / Procedures / Treatments   Labs (all labs ordered are listed,  but only abnormal results are displayed) Labs Reviewed  SARS CORONAVIRUS 2 BY RT PCR (HOSPITAL ORDER, PERFORMED IN [facility])    EKG None  Radiology No results found.  Procedures Procedures (including critical care time)  Medications Ordered in ED Medications - No data to display  ED Course  I have reviewed the triage vital signs and the nursing notes.  Pertinent labs & imaging results that were available during my care of the patient were reviewed by me and considered in my medical decision making (see chart for details).    MDM Rules/Calculators/A&P                          Differential diagnosis considered: Viral sinus infection, bacterial sinus infection, COVID-19, otitis media, otitis externa, vertigo  Patient presenting with relatively benign course of sinus pressures and pressure in his ears.  His exam is reassuring significant only for bilateral clear middle ear effusions, no vertigo.  No fevers or chills or purulent nasal discharge, have a very low suspicion for bacterial sinus infection, do not feel the patient would benefit from antibiotics.  We will perform Covid testing, patient counseled on self quarantine although relatively low suspicion  Provided with prescription for Flonase and Zyrtec-D  Counseled to follow-up with PCP in a week if symptoms persist.  Labs reviewed and interpreted by myself with significant findings above. Imaging reviewed by myself and interpreted by radiologist.  Case and plan above discussed with my attending Dr. Anitra Lauth  Final Clinical Impression(s) / ED Diagnoses Final diagnoses:  Congestion of nasal sinus    Rx / DC Orders ED Discharge Orders         Ordered    fluticasone (FLONASE) 50 MCG/ACT nasal spray  Daily        11/15/19 1538    cetirizine-pseudoephedrine (ZYRTEC-D) 5-120 MG tablet  Daily        11/15/19 1538         Labs, studies and imaging reviewed by myself and considered in medical decision  making if ordered. Imaging interpreted by radiology. Pt was discussed with my attending, Dr. Anitra Lauth  Electronically signed by:  Christiane Ha Redding9/9/20213:44 PM       Loree Fee, MD 11/15/19 1544    Gwyneth Sprout, MD 11/15/19 1553

## 2019-11-15 NOTE — ED Triage Notes (Addendum)
Pt reports sinus pressure and pain around ears x 3 days.  Denies fever.  No known COVID contacts.  Taking OTC sinus meds without relief.

## 2019-11-15 NOTE — ED Notes (Signed)
Reviewed discharge instructions with patient. Follow-up care and medications reviewed. Patient  verbalized understanding. Patient A&Ox4, VSS, and ambulatory with steady gait upon discharge.  °

## 2020-11-14 ENCOUNTER — Other Ambulatory Visit: Payer: Self-pay

## 2020-11-14 DIAGNOSIS — S4991XA Unspecified injury of right shoulder and upper arm, initial encounter: Secondary | ICD-10-CM | POA: Diagnosis present

## 2020-11-14 DIAGNOSIS — S46811A Strain of other muscles, fascia and tendons at shoulder and upper arm level, right arm, initial encounter: Secondary | ICD-10-CM | POA: Insufficient documentation

## 2020-11-14 DIAGNOSIS — X58XXXA Exposure to other specified factors, initial encounter: Secondary | ICD-10-CM | POA: Diagnosis not present

## 2020-11-14 DIAGNOSIS — J45909 Unspecified asthma, uncomplicated: Secondary | ICD-10-CM | POA: Diagnosis not present

## 2020-11-14 LAB — COMPREHENSIVE METABOLIC PANEL
ALT: 16 U/L (ref 0–44)
AST: 31 U/L (ref 15–41)
Albumin: 4.3 g/dL (ref 3.5–5.0)
Alkaline Phosphatase: 47 U/L (ref 38–126)
Anion gap: 8 (ref 5–15)
BUN: 11 mg/dL (ref 6–20)
CO2: 29 mmol/L (ref 22–32)
Calcium: 9.2 mg/dL (ref 8.9–10.3)
Chloride: 104 mmol/L (ref 98–111)
Creatinine, Ser: 0.93 mg/dL (ref 0.61–1.24)
GFR, Estimated: >60 mL/min (ref >60)
Glucose, Bld: 95 mg/dL (ref 70–99)
Potassium: 3.8 mmol/L (ref 3.5–5.1)
Sodium: 141 mmol/L (ref 135–145)
Total Bilirubin: 0.6 mg/dL (ref 0.3–1.2)
Total Protein: 7 g/dL (ref 6.5–8.1)

## 2020-11-14 NOTE — ED Triage Notes (Addendum)
Pt states is having muscle twitching to right shoulder radiating to right chest wall. Pt states also thinks he has something in his right eye. Pt appears in no acute distress. Pt states muscle twitching began 4 days ago after sleeping on right side. Pt states shoulder feels sore sometimes.

## 2020-11-15 ENCOUNTER — Emergency Department
Admission: EM | Admit: 2020-11-15 | Discharge: 2020-11-15 | Disposition: A | Discharge status: Home / Self Care | Payer: Managed Care, Other (non HMO) | Attending: Emergency Medicine | Admitting: Emergency Medicine

## 2020-11-15 DIAGNOSIS — S46811A Strain of other muscles, fascia and tendons at shoulder and upper arm level, right arm, initial encounter: Secondary | ICD-10-CM

## 2020-11-15 LAB — MAGNESIUM: Magnesium: 2.3 mg/dL (ref 1.7–2.4)

## 2020-11-15 NOTE — Discharge Instructions (Addendum)
You may alternate Tylenol 1000 mg every 6 hours as needed for pain, fever and Ibuprofen 800 mg every 8 hours as needed for pain, fever.  Please take Ibuprofen with food.  Do not take more than 4000 mg of Tylenol (acetaminophen) in a 24 hour period.   Steps to find a Primary Care Provider (PCP):  Call [phone] or [phone] to access "[facility] Find a Doctor Service."  2.  You may also go on the [facility] website at [email or web address]  

## 2020-11-15 NOTE — ED Provider Notes (Signed)
[facility] Emergency Department Provider Note  ____________________________________________   Event Date/Time   First MD Initiated Contact with Patient 11/15/20 0250     (approximate)  I have reviewed the triage vital signs and the nursing notes.   HISTORY  Chief Complaint muscle twitching    HPI Charles Huerta is a 35 y.o. male with history of asthma who is right-hand dominant who presents to the emergency department with muscle twitching and discomfort in the right upper extremity.  Denies any known injury to the arm.  States he feels like he is weaker in this arm and cannot do as many push-ups as he would normally do.  He describes it as feeling "like a pull and tightness".  No chest pain or shortness of breath.  No numbness or tingling.   Patient also complains of feeling like his right eye was irritated.  Thinks that he may have gotten something in it at work but now he feels like this has completely resolved.  No redness, tearing.  No vision changes.  No drainage.  No injury to the face or eye.        Past Medical History:  Diagnosis Date   Asthma when he was a child but grew out of it.    There are no problems to display for this patient.   No past surgical history on file.  Prior to Admission medications   Medication Sig Start Date End Date Taking? Authorizing Provider  cetirizine-pseudoephedrine (ZYRTEC-D) 5-120 MG tablet Take 1 tablet by mouth daily. 11/15/19   Loree Fee, MD  cyclobenzaprine (FLEXERIL) 10 MG tablet Take 1 tablet (10 mg total) by mouth 2 (two) times daily as needed for muscle spasms. 08/30/19   Walisiewicz, Kaitlyn E, PA-C  fluticasone (FLONASE) 50 MCG/ACT nasal spray Place 1 spray into both nostrils daily. 11/15/19 11/14/20  Loree Fee, MD    Allergies Sulfa antibiotics  No family history on file.  Social History Social History   Tobacco Use   Smoking status: Never   Smokeless tobacco: Never   Substance Use Topics   Alcohol use: No   Drug use: Yes    Types: Marijuana    Review of Systems Constitutional: No fever. Eyes: No visual changes. ENT: No sore throat. Cardiovascular: Denies chest pain. Respiratory: Denies shortness of breath. Gastrointestinal: No nausea, vomiting, diarrhea. Genitourinary: Negative for dysuria. Musculoskeletal: Negative for back pain. Skin: Negative for rash. Neurological: Negative for focal weakness or numbness.  ____________________________________________   PHYSICAL EXAM:  VITAL SIGNS: ED Triage Vitals  Enc Vitals Group     BP 11/14/20 2316 126/86     Pulse Rate 11/14/20 2316 65     Resp 11/14/20 2316 16     Temp 11/14/20 2316 98.8 F (37.1 C)     Temp Source 11/14/20 2316 Oral     SpO2 11/14/20 2316 98 %     Weight 11/14/20 2317 160 lb (72.6 kg)     Height 11/14/20 2317 5\' 11"  (1.803 m)     Head Circumference --      Peak Flow --      Pain Score 11/14/20 2317 8     Pain Loc --      Pain Edu? --      Excl. in GC? --    CONSTITUTIONAL: Alert and oriented and responds appropriately to questions. Well-appearing; well-nourished HEAD: Normocephalic EYES: Conjunctivae clear, pupils appear equal, EOM appear intact, no hyphema or hypopyon, no sign of foreign body,  no conjunctival injection, no tearing or drainage, normal visual fields ENT: normal nose; moist mucous membranes NECK: Supple, normal ROM, no midline spinal tenderness or step-off or deformity, tender to palpation over the right trapezius muscles without redness, warmth, ecchymosis, soft tissue swelling, rash or other lesions. CARD: RRR; S1 and S2 appreciated; no murmurs, no clicks, no rubs, no gallops RESP: Normal chest excursion without splinting or tachypnea; breath sounds clear and equal bilaterally; no wheezes, no rhonchi, no rales, no hypoxia or respiratory distress, speaking full sentences ABD/GI: Normal bowel sounds; non-distended; soft, non-tender, no rebound, no  guarding, no peritoneal signs, no hepatosplenomegaly BACK: The back appears normal EXT: Normal ROM in all joints; no deformity noted, no edema; no cyanosis, no tenderness palpation over the right upper extremity, compartments soft, 2+ radial pulses bilaterally, no joint effusion, no redness or warmth, normal capillary refill SKIN: Normal color for age and race; warm; no rash on exposed skin NEURO: Moves all extremities equally, normal sensation throughout all 4 extremities, normal gait, strength is 5/5 in bilateral upper extremities PSYCH: The patient's mood and manner are appropriate.  ____________________________________________   LABS (all labs ordered are listed, but only abnormal results are displayed)  Labs Reviewed  COMPREHENSIVE METABOLIC PANEL  MAGNESIUM   ____________________________________________  EKG   ____________________________________________  RADIOLOGY I, Ameila Weldon, personally viewed and evaluated these images (plain radiographs) as part of my medical decision making, as well as reviewing the written report by the radiologist.  ED MD interpretation:    Official radiology report(s): No results found.  ____________________________________________   PROCEDURES  Procedure(s) performed (including Critical Care):  Procedures   ____________________________________________   INITIAL IMPRESSION / ASSESSMENT AND PLAN / ED COURSE  As part of my medical decision making, I reviewed the following data within the electronic MEDICAL RECORD NUMBER Nursing notes reviewed and incorporated, Labs reviewed , Old chart reviewed, and Notes from prior ED visits         Patient here with muscle twitching in the right arm and feeling like this arm is weaker than normal.  He denies any known injury.  No sign of fracture, gout, septic arthritis, DVT, arterial obstruction, compartment syndrome, cellulitis, abscess on exam.  His electrolytes today are normal.  He states he feels  like the arm is weak but he has 5/5 strength in both upper extremities.  There is no asymmetry in his strength.  He has normal sensation diffusely.  Doubt CVA, multiple sclerosis, Guillain-Barr, transverse myelitis, and radiculopathy.  He does have some tenderness over the right trapezius muscle which may be why he is feeling a "pull" and "tightness".  No chest pain or shortness of breath.  Recommended alternating Tylenol, Motrin, heat and ice to this area.  He also complains of feeling like his right eye was irritated earlier today after he thinks dust or something else from work may have gotten into his eye.  He states that the symptoms have resolved.  He declines eye examination.  No sign of foreign body on limited exam.  No hyphema or hypopyon.  Normal visual fields.  At this time, I do not feel there is any life-threatening condition present. I have reviewed, interpreted and discussed all results (EKG, imaging, lab, urine as appropriate) and exam findings with patient/family. I have reviewed nursing notes and appropriate previous records.  I feel the patient is safe to be discharged home without further emergent workup and can continue workup as an outpatient as needed. Discussed usual and customary return precautions.  Patient/family verbalize understanding and are comfortable with this plan.  Outpatient follow-up has been provided as needed. All questions have been answered.  ____________________________________________   FINAL CLINICAL IMPRESSION(S) / ED DIAGNOSES  Final diagnoses:  Trapezius muscle strain, right, initial encounter     ED Discharge Orders     None       *Please note:  Charles Huerta was evaluated in Emergency Department on 11/15/2020 for the symptoms described in the history of present illness. He was evaluated in the context of the global COVID-19 pandemic, which necessitated consideration that the patient might be at risk for infection with the SARS-CoV-2 virus that  causes COVID-19. Institutional protocols and algorithms that pertain to the evaluation of patients at risk for COVID-19 are in a state of rapid change based on information released by regulatory bodies including the CDC and federal and state organizations. These policies and algorithms were followed during the patient's care in the ED.  Some ED evaluations and interventions may be delayed as a result of limited staffing during and the pandemic.*   Note:  This document was prepared using Dragon voice recognition software and may include unintentional dictation errors.    Ambra Haverstick, Layla Maw, DO 11/15/20 [phone]

## 2021-03-02 ENCOUNTER — Encounter: Payer: Self-pay | Admitting: Emergency Medicine

## 2021-03-02 ENCOUNTER — Emergency Department: Payer: Medicaid Other

## 2021-03-02 ENCOUNTER — Other Ambulatory Visit: Payer: Self-pay

## 2021-03-02 DIAGNOSIS — Z20822 Contact with and (suspected) exposure to covid-19: Secondary | ICD-10-CM | POA: Diagnosis not present

## 2021-03-02 DIAGNOSIS — R042 Hemoptysis: Secondary | ICD-10-CM | POA: Diagnosis present

## 2021-03-02 DIAGNOSIS — J45909 Unspecified asthma, uncomplicated: Secondary | ICD-10-CM | POA: Diagnosis not present

## 2021-03-02 LAB — CBC WITH DIFFERENTIAL/PLATELET
Abs Immature Granulocytes: 0.01 10*3/uL (ref 0.00–0.07)
Basophils Absolute: 0 10*3/uL (ref 0.0–0.1)
Basophils Relative: 1 %
Eosinophils Absolute: 0.1 10*3/uL (ref 0.0–0.5)
Eosinophils Relative: 1 %
HCT: 38.4 % — ABNORMAL LOW (ref 39.0–52.0)
Hemoglobin: 13.4 g/dL (ref 13.0–17.0)
Immature Granulocytes: 0 %
Lymphocytes Relative: 23 %
Lymphs Abs: 1 10*3/uL (ref 0.7–4.0)
MCH: 31.2 pg (ref 26.0–34.0)
MCHC: 34.9 g/dL (ref 30.0–36.0)
MCV: 89.3 fL (ref 80.0–100.0)
Monocytes Absolute: 0.3 10*3/uL (ref 0.1–1.0)
Monocytes Relative: 7 %
Neutro Abs: 2.8 10*3/uL (ref 1.7–7.7)
Neutrophils Relative %: 68 %
Platelets: 253 10*3/uL (ref 150–400)
RBC: 4.3 MIL/uL (ref 4.22–5.81)
RDW: 12.5 % (ref 11.5–15.5)
WBC: 4.2 10*3/uL (ref 4.0–10.5)
nRBC: 0 % (ref 0.0–0.2)

## 2021-03-02 LAB — BASIC METABOLIC PANEL
Anion gap: 6 (ref 5–15)
BUN: 7 mg/dL (ref 6–20)
CO2: 27 mmol/L (ref 22–32)
Calcium: 9.5 mg/dL (ref 8.9–10.3)
Chloride: 106 mmol/L (ref 98–111)
Creatinine, Ser: 0.76 mg/dL (ref 0.61–1.24)
GFR, Estimated: >60 mL/min (ref >60)
Glucose, Bld: 88 mg/dL (ref 70–99)
Potassium: 4.1 mmol/L (ref 3.5–5.1)
Sodium: 139 mmol/L (ref 135–145)

## 2021-03-02 LAB — D-DIMER, QUANTITATIVE: D-Dimer, Quant: 0.31 ug/mL-FEU (ref 0.00–0.50)

## 2021-03-02 NOTE — ED Triage Notes (Signed)
Pt to ED via POV with c/o coughing up blood, this am he thought that it was from his teeth, but he has kept having blood in his sputum. He has not had any cough. He said that he works in [employer] and that it makes him sick a lot.

## 2021-03-02 NOTE — ED Provider Notes (Signed)
Emergency Medicine Provider Triage Evaluation Note  Charles Huerta , a 35 y.o. male  was evaluated in triage.  Pt complains of hemoptysis since he awakened this morning. Blood in mucus with cough. No previous similar symptoms. Denies chest pain or shortness of breath.  Review of Systems  Positive: hemoptysis Negative: Chest pain  Physical Exam  BP 119/83 (BP Location: Left Arm)    Pulse 73    Temp 98.4 F (36.9 C) (Oral)    Resp 20    Ht 5\' 11"  (1.803 m)    Wt 72.6 kg    SpO2 100%    BMI 22.32 kg/m  Gen:   Awake, no distress   Resp:  Normal effort  MSK:   Moves extremities without difficulty  Other:    Medical Decision Making  Medically screening exam initiated at 4:05 PM.  Appropriate orders placed.  Charles Huerta was informed that the remainder of the evaluation will be completed by another provider, this initial triage assessment does not replace that evaluation, and the importance of remaining in the ED until their evaluation is complete.    Charles Lose, FNP 03/02/21 1615    03/04/21, MD 03/02/21 2322

## 2021-03-03 ENCOUNTER — Emergency Department
Admission: EM | Admit: 2021-03-03 | Discharge: 2021-03-03 | Disposition: A | Discharge status: Home / Self Care | Payer: Medicaid Other | Attending: Emergency Medicine | Admitting: Emergency Medicine

## 2021-03-03 DIAGNOSIS — R042 Hemoptysis: Secondary | ICD-10-CM

## 2021-03-03 LAB — RESP PANEL BY RT-PCR (FLU A&B, COVID) ARPGX2
Influenza A by PCR: NEGATIVE
Influenza B by PCR: NEGATIVE
SARS Coronavirus 2 by RT PCR: NEGATIVE

## 2021-03-03 NOTE — Discharge Instructions (Signed)
Although we did not identify a specific cause of the blood in your sputum, your evaluation was reassuring and there is no indication that you need to stay in the hospital at this time.  We recommend you follow-up with your primary care doctor or with a lung specialist such as Dr. Karna Christmas; you can call the number listed and asked them if it is possible to schedule a follow-up appointment with the pulmonologist.  Let them know you were seen in the emergency department for hemoptysis (coughing up blood).  Try to stay hydrated by drinking plenty of fluids.    Return to the emergency department if you develop new or worsening symptoms that concern you.

## 2021-03-03 NOTE — ED Provider Notes (Signed)
[facility] Emergency Department Provider Note  ____________________________________________   Event Date/Time   First MD Initiated Contact with Patient 03/03/21 [phone]     (approximate)  I have reviewed the triage vital signs and the nursing notes.   HISTORY  Chief Complaint Hemoptysis    HPI Charles Huerta is a 35 y.o. male who is otherwise healthy and reports mild well-controlled asthma but otherwise has no chronic medical conditions.  He presents for evaluation of acute onset blood in his sputum when he coughs.  It started within the last day.  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  He is not having shortness of breath but he noticed when he woke up and coughed or spit, there was some blood in it, and that has persisted throughout the day.  Cough is mild and there is no shortness of breath.  He denies fever, sore throat, chest pain, nausea, vomiting, and abdominal pain.  No recent weight loss, night sweats, etc.  He just wanted to check it out and see if everything was okay.  He said that even though he does not have a sore throat he occasionally feels like his throat and mouth are very dry.     Past Medical History:  Diagnosis Date   Asthma when he was a child but grew out of it.    There are no problems to display for this patient.   History reviewed. No pertinent surgical history.  Prior to Admission medications   Medication Sig Start Date End Date Taking? Authorizing Provider  cetirizine-pseudoephedrine (ZYRTEC-D) 5-120 MG tablet Take 1 tablet by mouth daily. 11/15/19   Loree Fee, MD  cyclobenzaprine (FLEXERIL) 10 MG tablet Take 1 tablet (10 mg total) by mouth 2 (two) times daily as needed for muscle spasms. 08/30/19   Walisiewicz, Kaitlyn E, PA-C  fluticasone (FLONASE) 50 MCG/ACT nasal spray Place 1 spray into both nostrils daily.  11/15/19 11/14/20  Loree Fee, MD    Allergies Sulfa antibiotics  No family history on file.  Social History Social History   Tobacco Use   Smoking status: Never   Smokeless tobacco: Never  Substance Use Topics   Alcohol use: No   Drug use: Yes    Types: Marijuana    Review of Systems Constitutional: No fever/chills Eyes: No visual changes. ENT: No sore throat. Cardiovascular: Denies chest pain. Respiratory: Positive for hemoptysis.  Mild cough.  Denies shortness of breath. Gastrointestinal: No abdominal pain.  No nausea, no vomiting.  No diarrhea.  No constipation. Genitourinary: Negative for dysuria. Musculoskeletal: Negative for neck pain.  Negative for back pain. Integumentary: Negative for rash. Neurological: Negative for headaches, focal weakness or numbness.   ____________________________________________   PHYSICAL EXAM:  VITAL SIGNS: ED Triage Vitals [03/02/21 1605]  Enc Vitals Group     BP 119/83     Pulse Rate 73     Resp 20     Temp 98.4 F (36.9 C)     Temp Source Oral     SpO2 100 %     Weight 72.6 kg (160 lb)     Height 1.803 m (5\' 11" )     Head Circumference      Peak Flow      Pain Score 5     Pain Loc      Pain Edu?      Excl. in  GC?     Constitutional: Alert and oriented.  Eyes: Conjunctivae are normal.  Head: Atraumatic. Nose: No congestion/rhinnorhea. Mouth/Throat: Patient is wearing a mask. Neck: No stridor.  No meningeal signs.   Cardiovascular: Normal rate, regular rhythm. Good peripheral circulation. Respiratory: Normal respiratory effort.  No retractions.  Lungs are clear to auscultation bilaterally with no wheezes, rales, nor rhonchi. Gastrointestinal: Soft and nontender. No distention.  Musculoskeletal: No lower extremity tenderness nor edema. No gross deformities of extremities. Neurologic:  Normal speech and language. No gross focal neurologic deficits are appreciated.  Skin:  Skin is warm, dry and  intact.  ____________________________________________   LABS (all labs ordered are listed, but only abnormal results are displayed)  Labs Reviewed  CBC WITH DIFFERENTIAL/PLATELET - Abnormal; Notable for the following components:      Result Value   HCT 38.4 (*)    All other components within normal limits  RESP PANEL BY RT-PCR (FLU A&B, COVID) ARPGX2  BASIC METABOLIC PANEL  D-DIMER, QUANTITATIVE   ____________________________________________   RADIOLOGY I, Loleta Rose, personally viewed and evaluated these images (plain radiographs) as part of my medical decision making, as well as reviewing the written report by the radiologist.  ED MD interpretation: No acute abnormalities on chest x-ray  Official radiology report(s): DG Chest 2 View  Result Date: 03/02/2021 CLINICAL DATA:  Hemoptysis EXAM: CHEST - 2 VIEW COMPARISON:  None FINDINGS: Normal heart size, mediastinal contours, and pulmonary vascularity. Lungs clear. No pleural effusion or pneumothorax. Bones unremarkable. IMPRESSION: Normal exam. Electronically Signed   By: Ulyses Southward M.D.   On: 03/02/2021 16:29    ____________________________________________   INITIAL IMPRESSION / MDM / ASSESSMENT AND PLAN / ED COURSE  As part of my medical decision making, I reviewed the following data within the electronic MEDICAL RECORD NUMBER Nursing notes reviewed and incorporated, Labs reviewed , Old chart reviewed, Radiograph reviewed , and Notes from prior ED visits   Differential diagnosis includes, but is not limited to, viral infection, pneumonia, malignancy, pneumonitis or chemical exposure, foreign body.  Vital signs are stable and within normal limits.  I personally reviewed the patient's imaging and agree with the radiologist's interpretation that there are no acute abnormalities on chest x-ray.  Respiratory viral panel is negative.  Basic metabolic panel, D-dimer, and CBC are all within normal limits.  Patient is well-appearing  and in no distress with no current symptoms.  I provided the reassuring results and explained that at this point there is no evidence of an acute or emergent medical condition that requires additional treatment or evaluation.  I provided information about following up with his PCP as well as giving him information about how to follow-up in the pulmonology clinic.  He states that he understands and he agrees with the current plan.  I gave my usual return precautions.   ____________________________________________  FINAL CLINICAL IMPRESSION(S) / ED DIAGNOSES  Final diagnoses:  Hemoptysis     MEDICATIONS GIVEN DURING THIS VISIT:  Medications - No data to display   ED Discharge Orders     None        Note:  This document was prepared using Dragon voice recognition software and may include unintentional dictation errors.   Loleta Rose, MD 03/03/21 [phone]

## 2021-03-19 ENCOUNTER — Other Ambulatory Visit: Payer: Self-pay | Admitting: Specialist

## 2021-03-19 DIAGNOSIS — R042 Hemoptysis: Secondary | ICD-10-CM

## 2021-03-23 ENCOUNTER — Ambulatory Visit: Payer: Medicaid Other

## 2021-04-03 ENCOUNTER — Ambulatory Visit: Payer: Medicaid Other

## 2021-04-17 ENCOUNTER — Ambulatory Visit: Payer: Medicaid Other

## 2021-10-31 ENCOUNTER — Encounter: Payer: Self-pay | Admitting: Emergency Medicine

## 2021-10-31 ENCOUNTER — Emergency Department
Admission: EM | Admit: 2021-10-31 | Discharge: 2021-10-31 | Disposition: A | Discharge status: Home / Self Care | Payer: Medicaid Other | Attending: Emergency Medicine | Admitting: Emergency Medicine

## 2021-10-31 ENCOUNTER — Other Ambulatory Visit: Payer: Self-pay

## 2021-10-31 DIAGNOSIS — K0889 Other specified disorders of teeth and supporting structures: Secondary | ICD-10-CM | POA: Diagnosis not present

## 2021-10-31 MED ORDER — KETOROLAC TROMETHAMINE 30 MG/ML IJ SOLN
30.0000 mg | Freq: Once | INTRAMUSCULAR | Status: AC
Start: 2021-10-31 — End: 2021-10-31
  Administered 2021-10-31: 30 mg via INTRAMUSCULAR
  Filled 2021-10-31: qty 1

## 2021-10-31 MED ORDER — AMOXICILLIN-POT CLAVULANATE 875-125 MG PO TABS
1.0000 | ORAL_TABLET | Freq: Once | ORAL | Status: AC
Start: 2021-10-31 — End: 2021-10-31
  Administered 2021-10-31: 1 via ORAL
  Filled 2021-10-31: qty 1

## 2021-10-31 MED ORDER — AMOXICILLIN-POT CLAVULANATE 875-125 MG PO TABS: 1.0000 | ORAL_TABLET | Freq: Two times a day (BID) | ORAL | 0 refills | Status: AC

## 2021-10-31 MED ORDER — KETOROLAC TROMETHAMINE 10 MG PO TABS: 10.0000 mg | ORAL_TABLET | Freq: Four times a day (QID) | ORAL | 0 refills | Status: DC | PRN

## 2021-10-31 NOTE — Discharge Instructions (Addendum)
You can take Toradol up to four times daily for five days.   OPTIONS FOR DENTAL FOLLOW UP CARE  [place] [facility] of Health and Human Services - Local Safety Net Dental Clinics [email or web address]   [facility] [phone])  [facility] [phone])  [place] [phone] ext [phone])  [facility] [phone])  [facility] [phone]) This clinic caters to the indigent population and is on a lottery system. Location: [employer] [facility], [employer], [address] [place] Clinic Hours: Wednesdays from 6pm - 9pm, patients seen by a lottery system. For dates, call or go to [email or web address] Services: Cleanings, fillings and simple extractions. Payment Options: DENTAL WORK IS FREE OF CHARGE. Bring proof of income or support. Best way to get seen: Arrive at 5:15 pm - this is a lottery, NOT first come/first serve, so arriving earlier will not increase your chances of being seen.     [facility] [phone] Select option 1 for emergencies   Location: [facility]istry, [place], [address], [place] Clinic Hours: No walk-ins accepted - call the day before to schedule an appointment. Check in times are 9:30 am and 1:30 pm. Services: Simple extractions, temporary fillings, pulpectomy/pulp debridement, uncomplicated abscess drainage. Payment Options: PAYMENT IS DUE AT THE TIME OF SERVICE.  Fee is usually $100-200, additional surgical procedures (e.g. abscess drainage) may be extra. Cash, checks, Visa/MasterCard accepted.  Can file Medicaid if patient is covered for dental - patient should call case worker to check. No discount for [facility] patients. Best way to get seen: MUST call the day before and get onto the schedule. Can usually be seen the next 1-2 days. No walk-ins  accepted.     [facility] [phone]   Location: [facility], [address], [place] Clinic Hours: M, W, Th, F 8am or 1:30pm, Tues 9a or 1:30 - first come/first served. Services: Simple extractions, temporary fillings, uncomplicated abscess drainage.  You do not need to be an [facility] resident. Payment Options: PAYMENT IS DUE AT THE TIME OF SERVICE. Dental insurance, otherwise sliding scale - bring proof of income or support. Depending on income and treatment needed, cost is usually $50-200. Best way to get seen: Arrive early as it is first come/first served.     [facility] [phone]   Location: [address] Clinic Hours: Mon-Thu 8a-5p Services: Most basic dental services including extractions and fillings. Payment Options: PAYMENT IS DUE AT THE TIME OF SERVICE. Sliding scale, up to 50% off - bring proof if income or support. Medicaid with dental option accepted. Best way to get seen: Call to schedule an appointment, can usually be seen within 2 weeks OR they will try to see walk-ins - show up at 8a or 2p (you may have to wait).     [facility] [phone] [place] RESIDENTS ONLY   Location: [facility], [address], [place], [place] [address] Clinic Hours: By appointment only. Monday - Thursday 8am-5pm, Friday 8am-12pm Services: Cleanings, fillings, extractions. Payment Options: PAYMENT IS DUE AT THE TIME OF SERVICE. Cash, Visa or MasterCard. Sliding scale - $30 minimum per service. Best way to get seen: Come in to office, complete packet and make an appointment - need proof of income or support monies for each household member and proof of [facility] residence. Usually takes about a month to get in.  [facility] [phone]   Location: [address], [place] Clinic Hours: Walk-in  Urgent Care Dental Services are offered Monday-Friday mornings only. The numbers of emergencies accepted daily is limited to the number of providers available. Maximum 15 - Mondays, Wednesdays & Thursdays Maximum 10 - Tuesdays & Fridays Services: You do not need to be a [facility] resident to be seen for a dental emergency. Emergencies are defined as pain, swelling, abnormal bleeding, or dental trauma. Walkins will receive x-rays if needed. NOTE: Dental cleaning is not an emergency. Payment Options: PAYMENT IS DUE AT THE TIME OF SERVICE. Minimum co-pay is $40.00 for uninsured patients. Minimum co-pay is $3.00 for Medicaid with dental coverage. Dental Insurance is accepted and must be presented at time of visit. Medicare does not cover dental. Forms of payment: Cash, credit card, checks. Best way to get seen: If not previously registered with the clinic, walk-in dental registration begins at 7:15 am and is on a first come/first serve basis. If previously registered with the clinic, call to make an appointment.     [facility] [place] ONLY   Location: [address] [place], [place] Clinic Hours: Mon-Thu 10a-2p Services: Extractions only! Payment Options: FREE (donations accepted) - bring proof of income or support Best way to get seen: Call and schedule an appointment OR come at 8am on the 1st Monday of every month (except for holidays) when it is first come/first served.     [facility] [phone]   Location: [place], [place] Clinic Hours: Friday mornings Services, Payment Options, Best way to get seen: Call for info

## 2021-10-31 NOTE — ED Notes (Signed)
Pt tried to give insurance info to this RN; redirected pt to registration. Will d/c soon.

## 2021-10-31 NOTE — ED Triage Notes (Signed)
Pt reports he has been having lower tooth pain for the past 2 days, pt reports pain feels to left side of ear and left jaw. Pt talks in complete sentences no distress noted

## 2021-10-31 NOTE — ED Provider Notes (Signed)
[facility] Provider Note  Patient Contact: 10:24 PM (approximate)   History   Dental Pain   HPI  Charles Huerta is a 36 y.o. male presents to the emergency department with lower dental pain.  Patient has broken inferior 18.  Patient states that he has pain that radiates along his face.  He has not made an appointment with a local dentist.  No pain under the tongue or difficulty swallowing.  Patient is maintaining his own secretions.      Physical Exam   Triage Vital Signs: ED Triage Vitals  Enc Vitals Group     BP 10/31/21 2145 109/76     Pulse Rate 10/31/21 2145 66     Resp 10/31/21 2145 16     Temp 10/31/21 2145 98.5 F (36.9 C)     Temp Source 10/31/21 2145 Oral     SpO2 10/31/21 2145 98 %     Weight 10/31/21 2148 165 lb (74.8 kg)     Height 10/31/21 2148 5\' 11"  (1.803 m)     Head Circumference --      Peak Flow --      Pain Score 10/31/21 2147 10     Pain Loc --      Pain Edu? --      Excl. in GC? --     Most recent vital signs: Vitals:   10/31/21 2209 10/31/21 2211  BP:  109/74  Pulse:  60  Resp: 15   Temp:    SpO2: 98%      General: Alert and in no acute distress. Eyes:  PERRL. EOMI. Head: No acute traumatic findings ENT:      Nose: No congestion/rhinnorhea.      Mouth/Throat: Mucous membranes are moist.  Patient has broken inferior 18. Neck: No stridor. No cervical spine tenderness to palpation. Cardiovascular:  Good peripheral perfusion Respiratory: Normal respiratory effort without tachypnea or retractions. Lungs CTAB. Good air entry to the bases with no decreased or absent breath sounds. Gastrointestinal: Bowel sounds 4 quadrants. Soft and nontender to palpation. No guarding or rigidity. No palpable masses. No distention. No CVA tenderness. Musculoskeletal: Full range of motion to all extremities.  Neurologic:  No gross focal neurologic deficits are appreciated.  Skin:   No rash noted    ED Results / Procedures  / Treatments   Labs (all labs ordered are listed, but only abnormal results are displayed) Labs Reviewed - No data to display      PROCEDURES:  Critical Care performed: No  Procedures   MEDICATIONS ORDERED IN ED: Medications  amoxicillin-clavulanate (AUGMENTIN) 875-125 MG per tablet 1 tablet (1 tablet Oral Given 10/31/21 2211)  ketorolac (TORADOL) 30 MG/ML injection 30 mg (30 mg Intramuscular Given 10/31/21 2210)     IMPRESSION / MDM / ASSESSMENT AND PLAN / ED COURSE  I reviewed the triage vital signs and the nursing notes.                              Assessment and plan Dental pain 36 year old male presents to the emergency department with broken inferior 18.  We will treat with Augmentin.  Patient was given an injection of Toradol in the emergency department and discharged with oral Toradol.  Dental resources were provided in patient's discharge paperwork.      FINAL CLINICAL IMPRESSION(S) / ED DIAGNOSES   Final diagnoses:  Pain, dental     Rx /  DC Orders   ED Discharge Orders          Ordered    ketorolac (TORADOL) 10 MG tablet  Every 6 hours PRN,   Status:  Discontinued        10/31/21 2202    ketorolac (TORADOL) 10 MG tablet  Every 6 hours PRN        10/31/21 2220    amoxicillin-clavulanate (AUGMENTIN) 875-125 MG tablet  2 times daily        10/31/21 2220             Note:  This document was prepared using Dragon voice recognition software and may include unintentional dictation errors.   Pia Mau [place], Cordelia Poche 10/31/21 2226    Sharman Cheek, MD 11/02/21 [phone]

## 2021-10-31 NOTE — ED Notes (Signed)
Lower L dental pain; denies fever but has taken tylenol today for pain and "warmness". Pt in NAD.

## 2021-11-01 ENCOUNTER — Telehealth: Payer: Self-pay | Admitting: Emergency Medicine

## 2021-11-01 MED ORDER — KETOROLAC TROMETHAMINE 10 MG PO TABS: 10.0000 mg | ORAL_TABLET | Freq: Four times a day (QID) | ORAL | 0 refills | Status: AC | PRN

## 2021-11-01 NOTE — Telephone Encounter (Cosign Needed)
Patient did not receive script for Toradol.

## 2022-08-16 ENCOUNTER — Emergency Department
Admission: EM | Admit: 2022-08-16 | Discharge: 2022-08-16 | Disposition: A | Discharge status: Home / Self Care | Payer: BC Managed Care – PPO | Attending: Emergency Medicine | Admitting: Emergency Medicine

## 2022-08-16 ENCOUNTER — Emergency Department: Payer: BC Managed Care – PPO

## 2022-08-16 ENCOUNTER — Other Ambulatory Visit: Payer: Self-pay

## 2022-08-16 DIAGNOSIS — Y9367 Activity, basketball: Secondary | ICD-10-CM | POA: Diagnosis not present

## 2022-08-16 DIAGNOSIS — X509XXA Other and unspecified overexertion or strenuous movements or postures, initial encounter: Secondary | ICD-10-CM | POA: Insufficient documentation

## 2022-08-16 DIAGNOSIS — S83411A Sprain of medial collateral ligament of right knee, initial encounter: Secondary | ICD-10-CM | POA: Diagnosis not present

## 2022-08-16 DIAGNOSIS — S8991XA Unspecified injury of right lower leg, initial encounter: Secondary | ICD-10-CM | POA: Diagnosis present

## 2022-08-16 NOTE — ED Triage Notes (Signed)
Pt to ed from home via POV for right sided knee pain x 3 months. Pt has continued to play basketball since the initial pain started with no issues. Pt denies any injuries. Pt is caox4, in no acute distress and ambulatory in triage. Pt is wearing a compression knee brace for comfort.

## 2022-08-16 NOTE — ED Provider Notes (Signed)
[facility] Provider Note    Event Date/Time   First MD Initiated Contact with Patient 08/16/22 1930     (approximate)   History   Knee Pain (RIGHT x 3 months)   HPI  Charles Huerta is a 37 y.o. male   Past medical history of no significant past medical history presents with R knee pain.  She had a knee injury approximately 3 months ago that had largely resolved but reinjured it yesterday playing basketball.  Thinks he may have twisted the knee.  He was able to ambulate he is able to ambulate today but has some pain on the medial side.  No other injuries noted.      Physical Exam   Triage Vital Signs: ED Triage Vitals  Enc Vitals Group     BP 08/16/22 1728 (!) 154/86     Pulse Rate 08/16/22 1728 65     Resp 08/16/22 1728 18     Temp 08/16/22 1728 97.6 F (36.4 C)     Temp Source 08/16/22 1728 Oral     SpO2 08/16/22 1728 98 %     Weight 08/16/22 1728 170 lb (77.1 kg)     Height 08/16/22 1728 5\' 11"  (1.803 m)     Head Circumference --      Peak Flow --      Pain Score 08/16/22 1740 10     Pain Loc --      Pain Edu? --      Excl. in GC? --     Most recent vital signs: Vitals:   08/16/22 1728 08/16/22 2028  BP: (!) 154/86 124/87  Pulse: 65 62  Resp: 18 15  Temp: 97.6 F (36.4 C) 97.6 F (36.4 C)  SpO2: 98% 100%    General: Awake, no distress.  CV:  Good peripheral perfusion.  Resp:  Normal effort.  Abd:  No distention.  Other:  There is no laxity with stressing of the knee in all directions but he does state there is some medial tenderness with valgus stress.  No significant tenderness to palpation.  No swelling.  Neurovascular intact.   ED Results / Procedures / Treatments   Labs (all labs ordered are listed, but only abnormal results are displayed) Labs Reviewed - No data to display  RADIOLOGY I independently reviewed and interpreted x-ray of the left knee and see no obvious fracture or  dislocation   PROCEDURES:  Critical Care performed: No  Procedures   MEDICATIONS ORDERED IN ED: Medications - No data to display  IMPRESSION / MDM / ASSESSMENT AND PLAN / ED COURSE  I reviewed the triage vital signs and the nursing notes.                                Patient's presentation is most consistent with acute presentation with potential threat to life or bodily function.  Differential diagnosis includes, but is not limited to, fracture, dislocation, ligamentous injury    MDM: Most consistent with ligamentous injury perhaps to the medial collateral ligament, ambulatory, negative x-ray, anticipatory guidance given and orthopedics follow-up if pain continues after 1 week.  Discharge.         FINAL CLINICAL IMPRESSION(S) / ED DIAGNOSES   Final diagnoses:  Sprain of medial collateral ligament of right knee, initial encounter     Rx / DC Orders   ED Discharge Orders     None  Note:  This document was prepared using Dragon voice recognition software and may include unintentional dictation errors.    Pilar Jarvis, MD 08/16/22 [phone]

## 2022-08-16 NOTE — Discharge Instructions (Addendum)
Take acetaminophen 650 mg and ibuprofen 400 mg every 6 hours for pain.  Take with food.  

## 2022-08-16 NOTE — ED Provider Triage Note (Signed)
Emergency Medicine Provider Triage Evaluation Note  Charles Huerta , a 37 y.o. male  was evaluated in triage.  Pt complains of right knee pain that started 2 months ago, went away but started again overnight after playing basketball yesterday. No new injury. .  Physical Exam  BP (!) 154/86 (BP Location: Left Arm)   Pulse 65   Temp 97.6 F (36.4 C) (Oral)   Resp 18   Ht 5\' 11"  (1.803 m)   Wt 77.1 kg   SpO2 98%   BMI 23.71 kg/m  Gen:   Awake, no distress   Resp:  Normal effort  MSK:   Moves extremities without difficulty  Other:    Medical Decision Making  Medically screening exam initiated at 5:40 PM.  Appropriate orders placed.  Charles Huerta was informed that the remainder of the evaluation will be completed by another provider, this initial triage assessment does not replace that evaluation, and the importance of remaining in the ED until their evaluation is complete.     Chinita Pester, FNP 08/16/22 [phone]

## 2022-09-02 ENCOUNTER — Other Ambulatory Visit: Payer: Self-pay | Admitting: Physician Assistant

## 2022-09-02 DIAGNOSIS — M2391 Unspecified internal derangement of right knee: Secondary | ICD-10-CM

## 2022-09-15 ENCOUNTER — Encounter: Payer: Self-pay | Admitting: Physician Assistant

## 2022-09-18 ENCOUNTER — Ambulatory Visit
Admission: RE | Admit: 2022-09-18 | Discharge: 2022-09-18 | Disposition: A | Discharge status: Home / Self Care | Payer: BC Managed Care – PPO | Source: Ambulatory Visit | Attending: Physician Assistant | Admitting: Physician Assistant

## 2022-09-18 DIAGNOSIS — M2391 Unspecified internal derangement of right knee: Secondary | ICD-10-CM

## 2023-09-26 ENCOUNTER — Emergency Department
Admission: EM | Admit: 2023-09-26 | Discharge: 2023-09-26 | Disposition: A | Discharge status: Home / Self Care | Attending: Emergency Medicine | Admitting: Emergency Medicine

## 2023-09-26 DIAGNOSIS — M79609 Pain in unspecified limb: Secondary | ICD-10-CM

## 2023-09-26 DIAGNOSIS — Y9367 Activity, basketball: Secondary | ICD-10-CM | POA: Insufficient documentation

## 2023-09-26 DIAGNOSIS — S39012A Strain of muscle, fascia and tendon of lower back, initial encounter: Secondary | ICD-10-CM | POA: Insufficient documentation

## 2023-09-26 DIAGNOSIS — X501XXA Overexertion from prolonged static or awkward postures, initial encounter: Secondary | ICD-10-CM | POA: Diagnosis not present

## 2023-09-26 DIAGNOSIS — S3992XA Unspecified injury of lower back, initial encounter: Secondary | ICD-10-CM | POA: Diagnosis present

## 2023-09-26 DIAGNOSIS — R202 Paresthesia of skin: Secondary | ICD-10-CM | POA: Diagnosis not present

## 2023-09-26 DIAGNOSIS — T148XXA Other injury of unspecified body region, initial encounter: Secondary | ICD-10-CM

## 2023-09-26 MED ORDER — PREDNISONE 10 MG (21) PO TBPK: ORAL_TABLET | ORAL | 0 refills | Status: AC

## 2023-09-26 MED ORDER — METHOCARBAMOL 500 MG PO TABS: 500.0000 mg | ORAL_TABLET | Freq: Three times a day (TID) | ORAL | 0 refills | Status: AC | PRN

## 2023-09-26 NOTE — ED Provider Notes (Signed)
 [facility] Provider Note    Event Date/Time   First MD Initiated Contact with Patient 09/26/23 1809     (approximate)   History   Back Pain   HPI Charles Huerta is a 38 y.o. male  with a past medical history of asthma presents to the emergency department with lower back pain and right shoulder pain extending into his hand on the radial side.  Lower back pain has been present x 1 day after playing basketball; worse with lumbar twisting and flexion motions.  Right shoulder pain has been present for 2 months, described as intermittent paresthesias with both elbow flexion and extension; also started when he was playing basketball.  Denies fall, fever, saddle anesthesia, bowel or bladder incontinence, IV drug use.  Patient has tried Tylenol without relief.       Physical Exam   Triage Vital Signs: ED Triage Vitals  Encounter Vitals Group     BP 09/26/23 1517 127/71     Girls Systolic BP Percentile --      Girls Diastolic BP Percentile --      Boys Systolic BP Percentile --      Boys Diastolic BP Percentile --      Pulse Rate 09/26/23 1517 77     Resp 09/26/23 1517 18     Temp 09/26/23 1519 98 F (36.7 C)     Temp Source 09/26/23 1517 Oral     SpO2 09/26/23 1517 100 %     Weight --      Height --      Head Circumference --      Peak Flow --      Pain Score 09/26/23 1518 10     Pain Loc --      Pain Education --      Exclude from Growth Chart --     Most recent vital signs: Vitals:   09/26/23 1517 09/26/23 1519  BP: 127/71   Pulse: 77   Resp: 18   Temp:  98 F (36.7 C)  SpO2: 100%     General: Awake, in no acute distress. Appears stated age. Head: Normocephalic, atraumatic. Eyes: PERRLA. EOMs intact. No scleral icterus or conjunctival injection. Ears/Nose/Throat: TMs intact b/l. Nares patent, no nasal discharge. Oropharynx moist, no erythema or exudate. Dentition intact. Neck: Supple, no lymphadenopathy, no JVD, no nuchal rigidity. CV:  Regular rate, 77 bpm. Peripheral pulses 2+ and symmetric. No edema. Respiratory: No respiratory distress. Normal respiratory effort. GI: Soft, non-distended, non-tender. No rebound or guarding.  MSK: Normal ROM and 5/5 strength in bilateral upper and lower extremities.  Ambulatory with normal gait pattern.  No tenderness to palpation of the right shoulder extending into the right arm.  Back pain is reproducible with lumbar flexion and he is tender on the left lower side.  No midline spinal tenderness. Skin:Warm, dry, intact. No rashes, lesions, or ecchymosis. No cyanosis or pallor. Neurological: A&Ox4 to person, place, time, and situation.  Sensation intact from C4-C8, L4-S1 bilaterally. No CVA tenderness bilaterally.  ED Results / Procedures / Treatments   Labs (all labs ordered are listed, but only abnormal results are displayed) Labs Reviewed - No data to display   EKG     RADIOLOGY     PROCEDURES:  Critical Care performed: No   Procedures   MEDICATIONS ORDERED IN ED: Medications - No data to display   IMPRESSION / MDM / ASSESSMENT AND PLAN / ED COURSE  I reviewed the triage  vital signs and the nursing notes.                              Differential diagnosis includes, but is not limited to, musculoskeletal strain, nerve compression, brachial plexus injury, cervical radiculopathy, medial or lateral epicondylitis  Patient's presentation is most consistent with acute, uncomplicated illness.  Patient is a 38 year old male who presented today with acute low back pain after an injury playing basketball. Physical exam shows back pain consistent with musculoskeletal injury worse with certain movements and described as pulling feeling.  No red flag symptoms of back pain.  He also has 2 months of intermittent paresthesias down the right arm on both the radial and ulnar side that also began when he was playing basketball.  He has full range of motion and strength bilaterally,  his symptoms seem to be a mix of epicondylitis picture versus a cervical radiculopathy versus nerve injury/brachial plexus injury.  I provided him with prednisone  taper and methocarbamol  to help with his symptoms.  I would like him to follow-up with his primary care provider.  Patient was given the opportunity to ask questions; all questions were answered. Emergency department return precautions were discussed with the patient.  Patient is in agreement to the treatment plan.  Patient is stable for discharge.      FINAL CLINICAL IMPRESSION(S) / ED DIAGNOSES   Final diagnoses:  Muscle strain  Paresthesia and pain of right extremity     Rx / DC Orders   ED Discharge Orders          Ordered    methocarbamol  (ROBAXIN ) 500 MG tablet  Every 8 hours PRN        09/26/23 1844    predniSONE  (STERAPRED UNI-PAK 21 TAB) 10 MG (21) TBPK tablet        09/26/23 1844             Note:  This document was prepared using Dragon voice recognition software and may include unintentional dictation errors.     Sheron Salm, PA-C 09/26/23 2037    Jossie Artist POUR, MD 09/26/23 [phone]

## 2023-09-26 NOTE — ED Provider Triage Note (Signed)
 Emergency Medicine Provider Triage Evaluation Note  Charles Huerta , Huerta 38 y.o. male  was evaluated in triage.  Pt complains of lower back pain x 1 day.  Patient reports he was playing baseball yesterday and squatted and felt Huerta sharp pain localized to his left lower back.  Denies falling.  Has tried Tylenol with no relief.  Endorses some radiation down left leg.  Review of Systems  Positive:  Negative: Chest pain, shortness of breath, urinary symptoms  Physical Exam  BP 127/71 (BP Location: Left Arm)   Pulse 77   Resp 18   SpO2 100%  Gen:   Awake, no distress   Resp:  Normal effort  MSK:   Moves extremities without difficulty  Other:  No midline tenderness of spinous process.  Medical Decision Making  Medically screening exam initiated at 3:18 PM.  Appropriate orders placed.  Charles Huerta was informed that the remainder of the evaluation will be completed by another provider, this initial triage assessment does not replace that evaluation, and the importance of remaining in the ED until their evaluation is complete.    Charles Huerta, Charles Delancey A, PA-C 09/26/23 1520

## 2023-09-26 NOTE — Discharge Instructions (Addendum)
 We believe that your symptoms are caused by musculoskeletal strain.  Please read through the included information about additional care such as heating pads, over-the-counter pain medicine. Remember that early mobility and using the affected part of your body is actually better than keeping it immobile.  Please take Tylenol as needed for pain, but only as written on the box.   You were prescribed Methocarbamol  (muscle relaxer) to help with your pain.  Please take these medications only as prescribed. Please do not work, make legal-binding decisions, or operate a motor vehicle while taking the Methocarbamol .   Follow-up with the doctor listed as recommended or return to the emergency department with new or worsening symptoms that concern you.

## 2023-09-26 NOTE — ED Triage Notes (Signed)
 States he was squatting and felt a pulling sensation from back to neck and pain to R hand. States he was playing basketball yesterday and played through it but pain has been persistent. Taking tylenol and ibuprofen  with no relief. No bowel or bladder incontinence noted.

## 2023-10-29 ENCOUNTER — Encounter (HOSPITAL_COMMUNITY): Payer: Self-pay | Admitting: *Deleted

## 2023-10-29 ENCOUNTER — Emergency Department (HOSPITAL_COMMUNITY)

## 2023-10-29 ENCOUNTER — Emergency Department (HOSPITAL_COMMUNITY)
Admission: EM | Admit: 2023-10-29 | Discharge: 2023-10-29 | Disposition: A | Discharge status: Home / Self Care | Attending: Emergency Medicine | Admitting: Emergency Medicine

## 2023-10-29 ENCOUNTER — Other Ambulatory Visit: Payer: Self-pay

## 2023-10-29 DIAGNOSIS — N390 Urinary tract infection, site not specified: Secondary | ICD-10-CM | POA: Insufficient documentation

## 2023-10-29 DIAGNOSIS — R319 Hematuria, unspecified: Secondary | ICD-10-CM | POA: Diagnosis present

## 2023-10-29 LAB — URINALYSIS, ROUTINE W REFLEX MICROSCOPIC
Bilirubin Urine: NEGATIVE
Glucose, UA: NEGATIVE mg/dL
Ketones, ur: NEGATIVE mg/dL
Nitrite: NEGATIVE
Protein, ur: NEGATIVE mg/dL
Specific Gravity, Urine: 1.003 — ABNORMAL LOW (ref 1.005–1.030)
WBC, UA: >50 WBC/hpf (ref 0–5)
pH: 6 (ref 5.0–8.0)

## 2023-10-29 LAB — CBC
HCT: 38.6 % — ABNORMAL LOW (ref 39.0–52.0)
Hemoglobin: 13.4 g/dL (ref 13.0–17.0)
MCH: 31 pg (ref 26.0–34.0)
MCHC: 34.7 g/dL (ref 30.0–36.0)
MCV: 89.4 fL (ref 80.0–100.0)
Platelets: 259 K/uL (ref 150–400)
RBC: 4.32 MIL/uL (ref 4.22–5.81)
RDW: 12.2 % (ref 11.5–15.5)
WBC: 10.3 K/uL (ref 4.0–10.5)
nRBC: 0 % (ref 0.0–0.2)

## 2023-10-29 LAB — COMPREHENSIVE METABOLIC PANEL WITH GFR
ALT: 13 U/L (ref 0–44)
AST: 20 U/L (ref 15–41)
Albumin: 4 g/dL (ref 3.5–5.0)
Alkaline Phosphatase: 45 U/L (ref 38–126)
Anion gap: 9 (ref 5–15)
BUN: 9 mg/dL (ref 6–20)
CO2: 25 mmol/L (ref 22–32)
Calcium: 9.2 mg/dL (ref 8.9–10.3)
Chloride: 103 mmol/L (ref 98–111)
Creatinine, Ser: 0.95 mg/dL (ref 0.61–1.24)
GFR, Estimated: >60 mL/min (ref >60)
Glucose, Bld: 94 mg/dL (ref 70–99)
Potassium: 3.5 mmol/L (ref 3.5–5.1)
Sodium: 137 mmol/L (ref 135–145)
Total Bilirubin: 0.7 mg/dL (ref 0.0–1.2)
Total Protein: 6.9 g/dL (ref 6.5–8.1)

## 2023-10-29 MED ORDER — CEPHALEXIN 500 MG PO CAPS: 500.0000 mg | ORAL_CAPSULE | Freq: Two times a day (BID) | ORAL | 0 refills | Status: AC

## 2023-10-29 MED ORDER — CEPHALEXIN 250 MG PO CAPS
500.0000 mg | ORAL_CAPSULE | Freq: Once | ORAL | Status: AC
  Administered 2023-10-29: 500 mg via ORAL
  Filled 2023-10-29: qty 2

## 2023-10-29 NOTE — ED Provider Notes (Signed)
 [place] EMERGENCY DEPARTMENT AT [facility] Provider Note   CSN: [id number] Arrival date & time: 10/29/23  0005     Patient presents with: Hematuria   Charles Huerta is a 38 y.o. male presents today for hematuria x 2 days.  Patient reports urgency, frequency, and mild dysuria.  Patient denies fever, chills, nausea, vomiting, penile discharge, flank pain, abdominal pain, any other complaints at this time.  Patient reports that he has had a UTI previously and has not seen urology before.  Patient reports that he is circumcised.    Hematuria      Prior to Admission medications   Medication Sig Start Date End Date Taking? Authorizing Provider  cephALEXin  (KEFLEX ) 500 MG capsule Take 1 capsule (500 mg total) by mouth 2 (two) times daily. 10/29/23  Yes Aleka Twitty N, PA-C  cetirizine -pseudoephedrine  (ZYRTEC -D) 5-120 MG tablet Take 1 tablet by mouth daily. 11/15/19   Dottie Carrier, MD  fluticasone  (FLONASE ) 50 MCG/ACT nasal spray Place 1 spray into both nostrils daily. 11/15/19 11/14/20  Dottie Carrier, MD  ketorolac  (TORADOL ) 10 MG tablet Take 1 tablet (10 mg total) by mouth every 6 (six) hours as needed. 11/01/21   Woods, Jaclyn M, PA-C  predniSONE  (STERAPRED UNI-PAK 21 TAB) 10 MG (21) TBPK tablet Take six pills on day 1, five pills on day 2, four pills on day 3, three pills on day 4, two pills on day 5, and one pill on day 6. 09/26/23   Sheron, Summer, PA-C    Allergies: Sulfa antibiotics    Review of Systems  Genitourinary:  Positive for dysuria, frequency, hematuria and urgency.    Updated Vital Signs BP 115/77   Pulse 62   Temp 98.2 F (36.8 C) (Oral)   Resp 18   Ht 5' 11 (1.803 m)   Wt 77.1 kg   SpO2 98%   BMI 23.71 kg/m   Physical Exam Vitals and nursing note reviewed.  Constitutional:      General: He is not in acute distress.    Appearance: Normal appearance. He is well-developed. He is not ill-appearing.  HENT:     Head: Normocephalic and  atraumatic.     Right Ear: External ear normal.     Left Ear: External ear normal.     Mouth/Throat:     Mouth: Mucous membranes are moist.     Pharynx: Oropharynx is clear.  Eyes:     Extraocular Movements: Extraocular movements intact.     Conjunctiva/sclera: Conjunctivae normal.  Cardiovascular:     Rate and Rhythm: Normal rate and regular rhythm.     Pulses: Normal pulses.     Heart sounds: No murmur heard. Pulmonary:     Effort: Pulmonary effort is normal. No respiratory distress.  Abdominal:     General: There is no distension.     Palpations: Abdomen is soft.     Tenderness: There is no abdominal tenderness. There is no right CVA tenderness or left CVA tenderness.  Musculoskeletal:        General: No swelling.     Cervical back: Neck supple.  Skin:    General: Skin is warm and dry.     Capillary Refill: Capillary refill takes less than 2 seconds.  Neurological:     General: No focal deficit present.     Mental Status: He is alert and oriented to person, place, and time.  Psychiatric:        Mood and Affect: Mood normal.     (  all labs ordered are listed, but only abnormal results are displayed) Labs Reviewed  CBC - Abnormal; Notable for the following components:      Result Value   HCT 38.6 (*)    All other components within normal limits  URINALYSIS, ROUTINE W REFLEX MICROSCOPIC - Abnormal; Notable for the following components:   APPearance CLOUDY (*)    Specific Gravity, Urine 1.003 (*)    Hgb urine dipstick LARGE (*)    Leukocytes,Ua LARGE (*)    Bacteria, UA MANY (*)    All other components within normal limits  URINE CULTURE  COMPREHENSIVE METABOLIC PANEL WITH GFR    EKG: None  Radiology: CT Renal Stone Study Result Date: 10/29/2023 CLINICAL DATA:  Hematuria and flank pain EXAM: CT ABDOMEN AND PELVIS WITHOUT CONTRAST TECHNIQUE: Multidetector CT imaging of the abdomen and pelvis was performed following the standard protocol without IV contrast.  RADIATION DOSE REDUCTION: This exam was performed according to the departmental dose-optimization program which includes automated exposure control, adjustment of the mA and/or kV according to patient size and/or use of iterative reconstruction technique. COMPARISON:  None Available. FINDINGS: Lower chest: No acute abnormality. Hepatobiliary: No focal liver abnormality is seen. No gallstones, gallbladder wall thickening, or biliary dilatation. Pancreas: Unremarkable. No pancreatic ductal dilatation or surrounding inflammatory changes. Spleen: Normal in size without focal abnormality. Adrenals/Urinary Tract: Adrenal glands are within normal limits. Kidneys are well visualized without renal calculi or obstructive change. The bladder is partially distended. Stomach/Bowel: The appendix is within normal limits. No obstructive or inflammatory changes of the colon are seen. Stomach and small bowel are unremarkable. Vascular/Lymphatic: No significant vascular findings are present. No enlarged abdominal or pelvic lymph nodes. Reproductive: Prostate is unremarkable. Other: No abdominal wall hernia or abnormality. No abdominopelvic ascites. Musculoskeletal: No acute or significant osseous findings. IMPRESSION: No acute abnormality noted. Electronically Signed   By: Oneil Devonshire M.D.   On: 10/29/2023 01:46     Procedures   Medications Ordered in the ED  cephALEXin  (KEFLEX ) capsule 500 mg (has no administration in time range)                                    Medical Decision Making  This patient presents to the ED for concern of UTI symptoms differential diagnosis includes UTI, pyelonephritis, septic stone, kidney stone  Lab Tests:  I Ordered, and personally interpreted labs.  The pertinent results include: CMP WNL, CBC unremarkable, UA with large hemoglobin, large leukocytes, many bacteria, 6-10 RBCs, and greater than 50 WBCs   Imaging Studies ordered:  I ordered imaging studies including CT renal  stone study I independently visualized and interpreted imaging which showed negative I agree with the radiologist interpretation   Medicines ordered and prescription drug management:  I ordered medication including Keflex     I have reviewed the patients home medicines and have made adjustments as needed   Problem List / ED Course:  Considered for admission or further workup however patient's vital signs, physical exam, labs, and imaging are reassuring.  Patient symptoms likely due to UTI.  Patient has no signs or symptoms concerning for STD.  Patient advised to pick up outpatient antibiotics and follow-up with urology for further evaluation workup.  Patient given return precautions.  I feel patient is safe for discharge at this time.       Final diagnoses:  Urinary tract infection with hematuria, site unspecified  ED Discharge Orders          Ordered    cephALEXin  (KEFLEX ) 500 MG capsule  2 times daily        10/29/23 0153               Francis Ileana SAILOR, PA-C 10/29/23 0154    Palumbo, April, MD 10/29/23 [phone]

## 2023-10-29 NOTE — Discharge Instructions (Addendum)
 Today you were seen for a urinary tract infection.  Please pick up your antibiotic and take as prescribed.  Please follow-up with urology for further evaluation workup.  Thank you for letting us  treat you today. After reviewing your labs and imaging, I feel you are safe to go home. Please follow up with your PCP in the next several days and provide them with your records from this visit. Return to the Emergency Room if pain becomes severe or symptoms worsen.

## 2023-10-29 NOTE — ED Triage Notes (Signed)
 The pt is c/o blood in his urine for 2 days

## 2023-10-31 LAB — URINE CULTURE
Culture: >=100000 — AB
Special Requests: NORMAL
